# Patient Record
Sex: Male | Born: 1970 | Race: White | Hispanic: No | Marital: Single | Smoking: Current every day smoker
Health system: Southern US, Community
[De-identification: ages and names within clinical notes are randomized; demographics above are authoritative.]

---

## 2012-10-15 ENCOUNTER — Other Ambulatory Visit: Payer: Self-pay | Admitting: Family Medicine

## 2012-10-15 ENCOUNTER — Ambulatory Visit
Admission: RE | Admit: 2012-10-15 | Discharge: 2012-10-15 | Disposition: A | Discharge status: Home / Self Care | Payer: BC Managed Care – PPO | Source: Ambulatory Visit | Attending: Family Medicine | Admitting: Family Medicine

## 2012-10-15 DIAGNOSIS — R52 Pain, unspecified: Secondary | ICD-10-CM

## 2012-10-15 DIAGNOSIS — R059 Cough, unspecified: Secondary | ICD-10-CM

## 2012-10-15 DIAGNOSIS — R05 Cough: Secondary | ICD-10-CM

## 2013-11-03 ENCOUNTER — Other Ambulatory Visit: Payer: Self-pay | Admitting: Family Medicine

## 2013-11-03 DIAGNOSIS — R0789 Other chest pain: Secondary | ICD-10-CM

## 2013-11-10 ENCOUNTER — Ambulatory Visit
Admission: RE | Admit: 2013-11-10 | Discharge: 2013-11-10 | Disposition: A | Discharge status: Home / Self Care | Payer: BC Managed Care – PPO | Source: Ambulatory Visit | Attending: Family Medicine | Admitting: Family Medicine

## 2013-11-10 DIAGNOSIS — R0789 Other chest pain: Secondary | ICD-10-CM

## 2013-11-10 MED ORDER — IOHEXOL 300 MG/ML  SOLN
75.0000 mL | Freq: Once | INTRAMUSCULAR | Status: AC | PRN
  Administered 2013-11-10: 75 mL via INTRAVENOUS

## 2013-11-13 ENCOUNTER — Other Ambulatory Visit: Payer: Self-pay | Admitting: Family Medicine

## 2013-11-13 DIAGNOSIS — R1011 Right upper quadrant pain: Secondary | ICD-10-CM

## 2013-12-02 ENCOUNTER — Ambulatory Visit
Admission: RE | Admit: 2013-12-02 | Discharge: 2013-12-02 | Disposition: A | Discharge status: Home / Self Care | Payer: BC Managed Care – PPO | Source: Ambulatory Visit | Attending: Family Medicine | Admitting: Family Medicine

## 2013-12-02 DIAGNOSIS — R1011 Right upper quadrant pain: Secondary | ICD-10-CM

## 2014-01-12 ENCOUNTER — Ambulatory Visit: Payer: BC Managed Care – PPO | Admitting: Dietician

## 2014-05-04 ENCOUNTER — Other Ambulatory Visit: Payer: Self-pay | Admitting: Geriatric Medicine

## 2014-05-04 DIAGNOSIS — R911 Solitary pulmonary nodule: Secondary | ICD-10-CM

## 2014-05-07 ENCOUNTER — Ambulatory Visit
Admission: RE | Admit: 2014-05-07 | Discharge: 2014-05-07 | Disposition: A | Discharge status: Home / Self Care | Payer: BC Managed Care – PPO | Source: Ambulatory Visit | Attending: Geriatric Medicine | Admitting: Geriatric Medicine

## 2014-05-07 DIAGNOSIS — R911 Solitary pulmonary nodule: Secondary | ICD-10-CM

## 2015-05-06 ENCOUNTER — Other Ambulatory Visit: Payer: Self-pay | Admitting: Geriatric Medicine

## 2015-05-06 DIAGNOSIS — R911 Solitary pulmonary nodule: Secondary | ICD-10-CM

## 2015-05-07 ENCOUNTER — Ambulatory Visit
Admission: RE | Admit: 2015-05-07 | Discharge: 2015-05-07 | Disposition: A | Discharge status: Home / Self Care | Payer: BC Managed Care – PPO | Source: Ambulatory Visit | Attending: Geriatric Medicine | Admitting: Geriatric Medicine

## 2015-05-07 DIAGNOSIS — R911 Solitary pulmonary nodule: Secondary | ICD-10-CM

## 2015-06-16 IMAGING — US US ABDOMEN COMPLETE
1 series · 14 of 25 positions shown · non-contrast
Comparison: None.

CLINICAL DATA: Right upper quadrant abdominal pain.

EXAM:
ULTRASOUND ABDOMEN COMPLETE

[Series 1: us abdomen complete · 0.41mm/px · 14 of 99 slices shown]
[im 1/99]
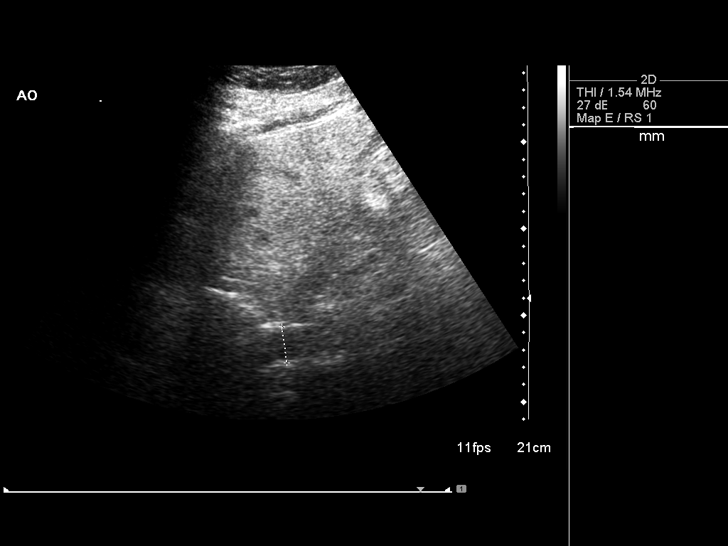
[im 9/99]
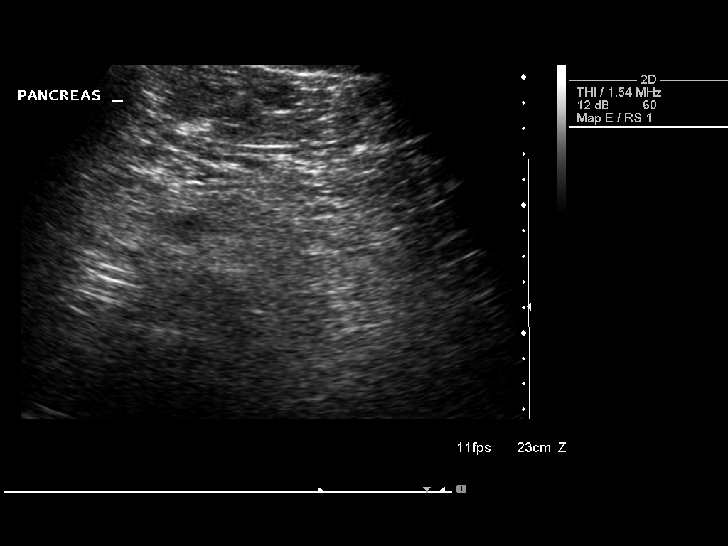
[im 17/99]
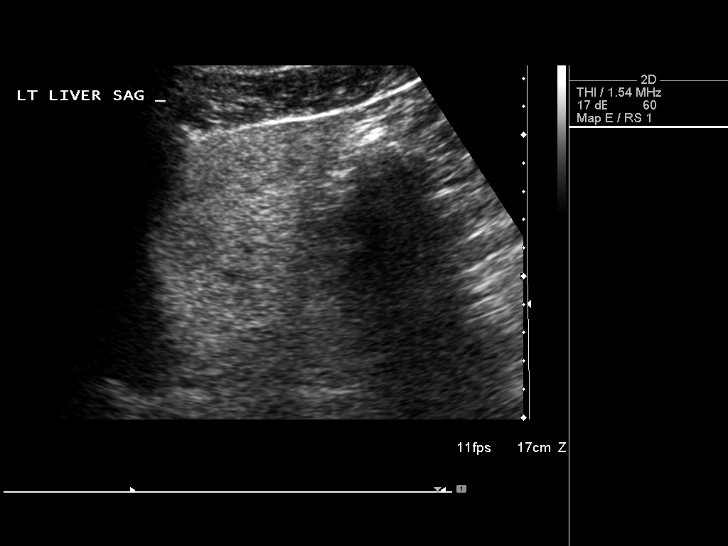
[im 25/99]
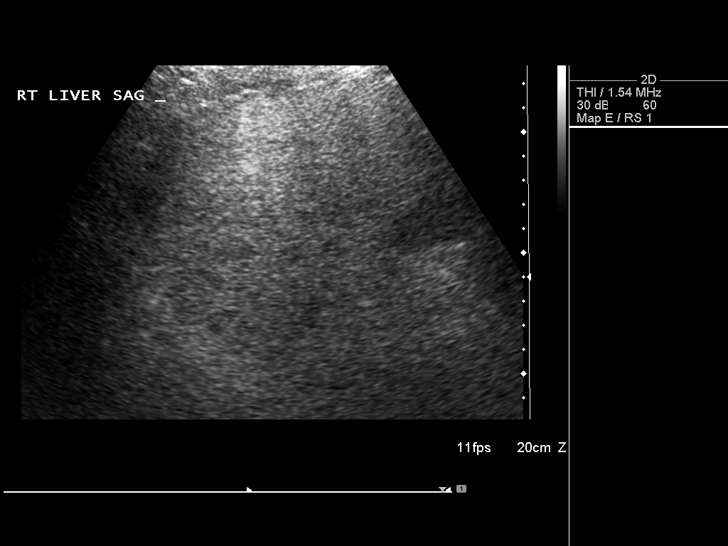
[im 33/99]
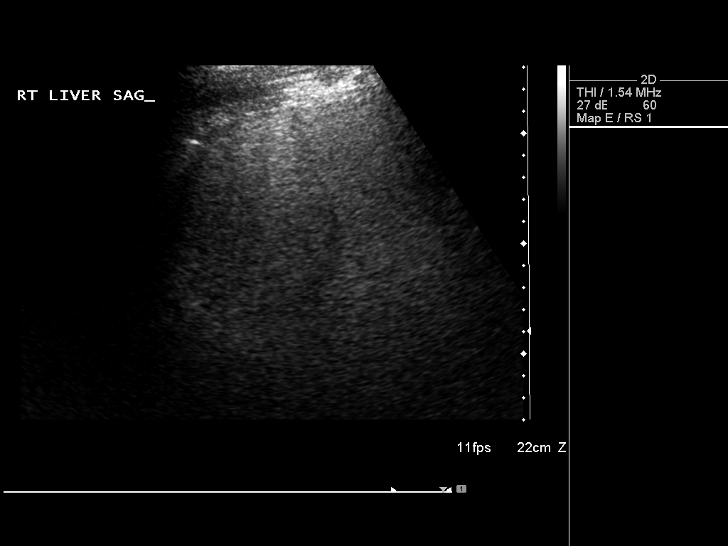
[im 37/99]
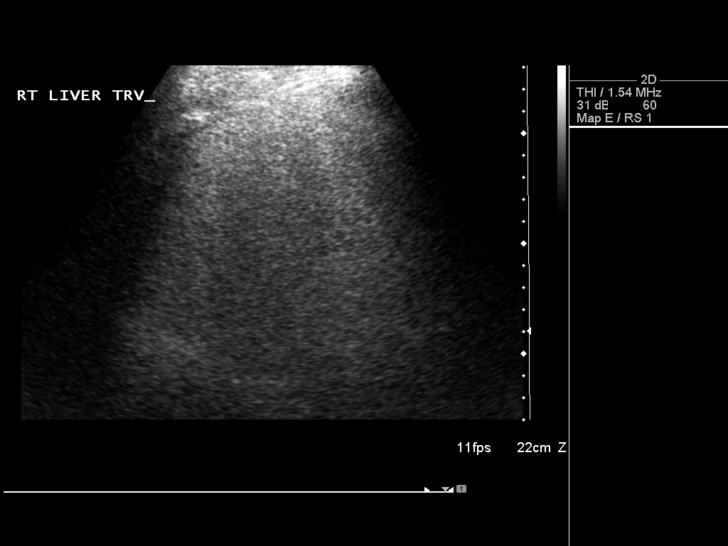
[im 45/99]
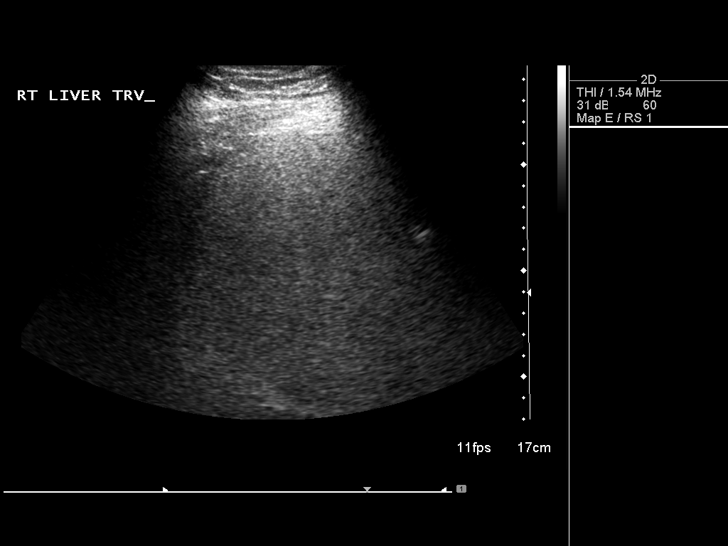
[im 54/99]
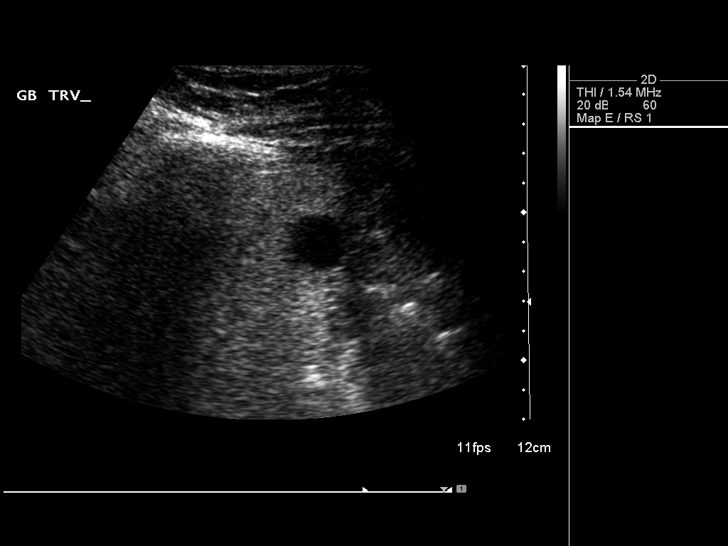
[im 62/99]
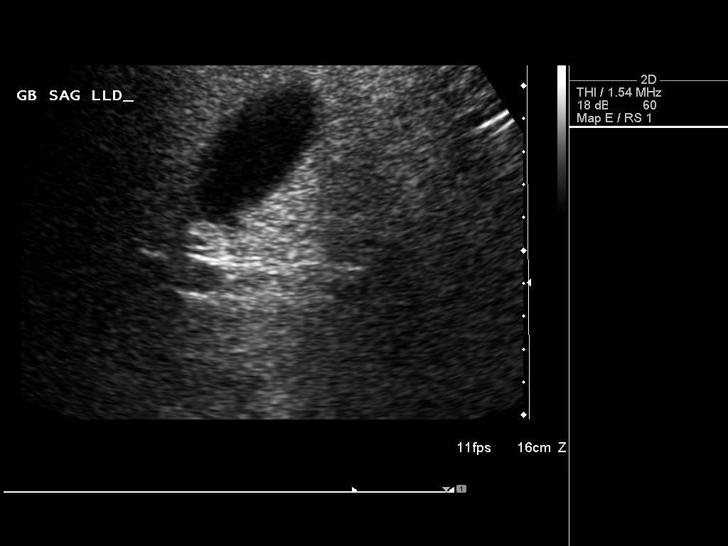
[im 66/99]
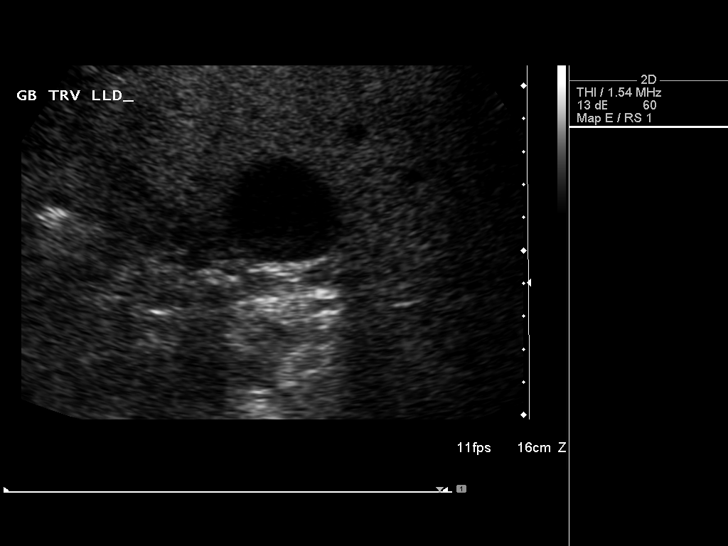
[im 74/99]
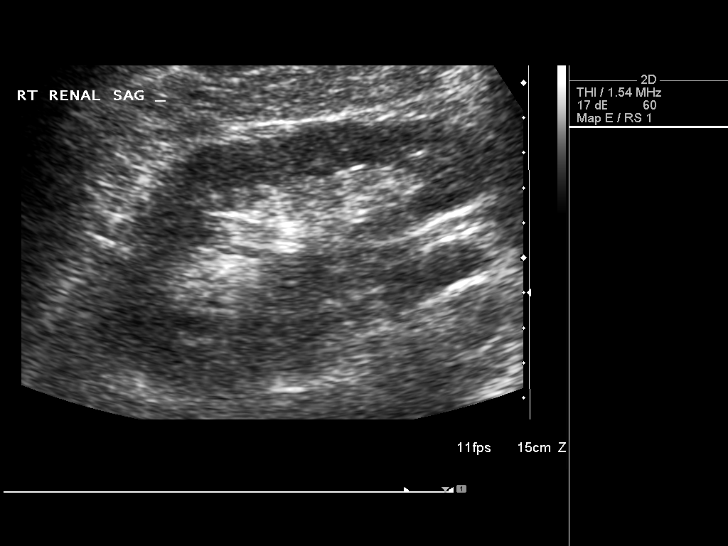
[im 82/99]
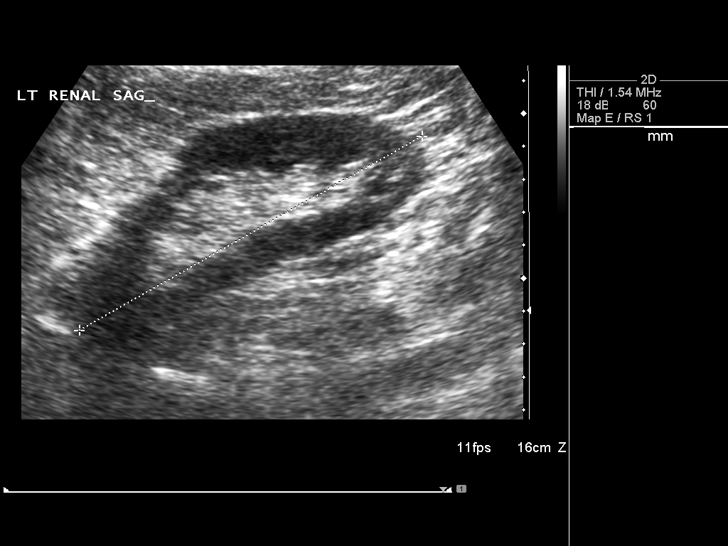
[im 90/99]
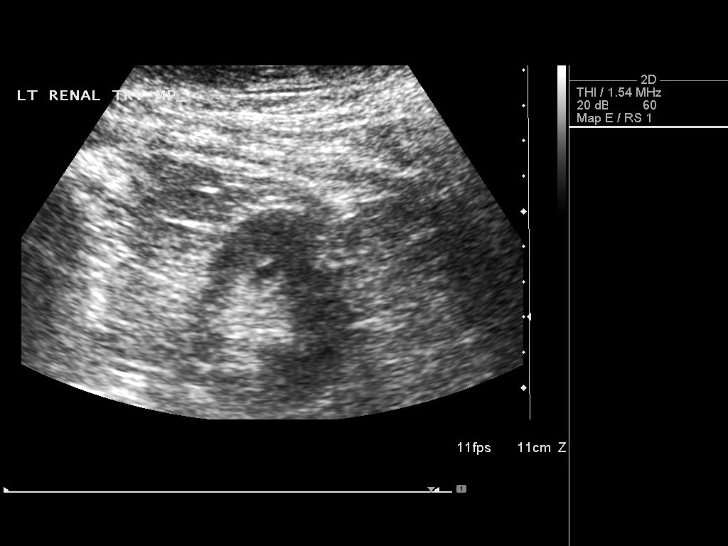
[im 99/99]
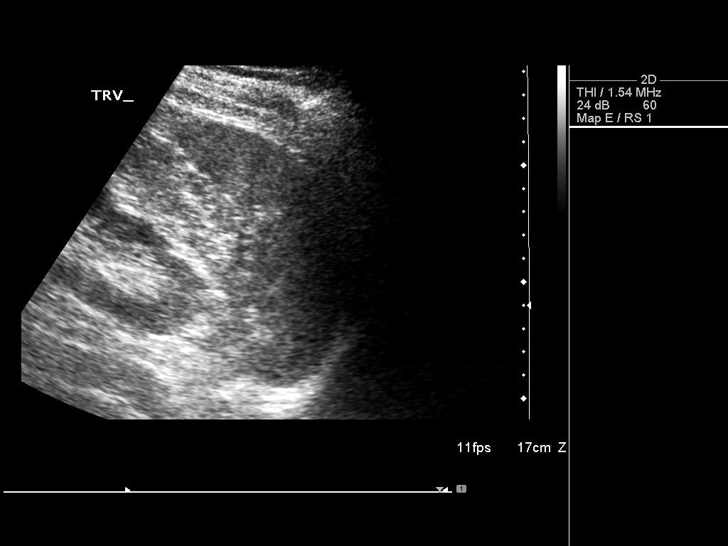

[14 of 25 positions shown; findings below may reference images not displayed]

FINDINGS: Gallbladder: No gallstones or wall thickening visualized. No
sonographic Murphy sign noted.

Common bile duct: Diameter: 3.5 mm

Liver: Liver is echogenic with decreased through transmission of the
sound beam. No liver mass or focal lesion. Hepatopetal flow
documented in the portal vein. Liver normal in overall size.

IVC: No abnormality visualized.

Pancreas: Visualized portion unremarkable.

Spleen: Size and appearance within normal limits.

Right Kidney: Length: 13.0 cm. Echogenicity within normal limits. No
mass or hydronephrosis visualized.

Left Kidney: Length: 12.7 cm. Echogenicity within normal limits. No
mass or hydronephrosis visualized.

Abdominal aorta: No aneurysm visualized.

Other findings: None.
IMPRESSION: 1. No acute findings.  Normal gallbladder.  No bile duct dilation.
2. Hepatic steatosis.  No other abnormalities.

## 2016-06-09 ENCOUNTER — Other Ambulatory Visit: Payer: Self-pay | Admitting: Geriatric Medicine

## 2016-06-09 DIAGNOSIS — R911 Solitary pulmonary nodule: Secondary | ICD-10-CM

## 2016-06-15 ENCOUNTER — Other Ambulatory Visit: Payer: BC Managed Care – PPO

## 2018-08-16 ENCOUNTER — Other Ambulatory Visit: Payer: Self-pay | Admitting: Geriatric Medicine

## 2018-08-16 DIAGNOSIS — R911 Solitary pulmonary nodule: Secondary | ICD-10-CM

## 2018-11-21 ENCOUNTER — Ambulatory Visit
Admission: RE | Admit: 2018-11-21 | Discharge: 2018-11-21 | Disposition: A | Discharge status: Home / Self Care | Payer: BC Managed Care – PPO | Source: Ambulatory Visit | Attending: Geriatric Medicine | Admitting: Geriatric Medicine

## 2018-11-21 DIAGNOSIS — R911 Solitary pulmonary nodule: Secondary | ICD-10-CM

## 2019-09-10 ENCOUNTER — Other Ambulatory Visit: Payer: Self-pay | Admitting: Geriatric Medicine

## 2019-09-10 ENCOUNTER — Other Ambulatory Visit (HOSPITAL_COMMUNITY): Payer: Self-pay | Admitting: Geriatric Medicine

## 2019-09-10 DIAGNOSIS — K760 Fatty (change of) liver, not elsewhere classified: Secondary | ICD-10-CM

## 2019-09-16 ENCOUNTER — Other Ambulatory Visit: Payer: Self-pay

## 2019-09-16 ENCOUNTER — Ambulatory Visit (HOSPITAL_COMMUNITY)
Admission: RE | Admit: 2019-09-16 | Discharge: 2019-09-16 | Disposition: A | Discharge status: Home / Self Care | Payer: BC Managed Care – PPO | Source: Ambulatory Visit | Attending: Geriatric Medicine | Admitting: Geriatric Medicine

## 2019-09-16 DIAGNOSIS — K760 Fatty (change of) liver, not elsewhere classified: Secondary | ICD-10-CM | POA: Insufficient documentation

## 2020-10-05 ENCOUNTER — Other Ambulatory Visit: Payer: Self-pay

## 2020-10-05 ENCOUNTER — Encounter (HOSPITAL_COMMUNITY): Payer: Self-pay | Admitting: Emergency Medicine

## 2020-10-05 ENCOUNTER — Emergency Department (HOSPITAL_COMMUNITY): Payer: BC Managed Care – PPO

## 2020-10-05 ENCOUNTER — Observation Stay (HOSPITAL_COMMUNITY)
Admission: EM | Admit: 2020-10-05 | Discharge: 2020-10-07 | Disposition: A | Discharge status: Home / Self Care | Payer: BC Managed Care – PPO | Attending: Internal Medicine | Admitting: Internal Medicine

## 2020-10-05 DIAGNOSIS — I214 Non-ST elevation (NSTEMI) myocardial infarction: Secondary | ICD-10-CM | POA: Diagnosis not present

## 2020-10-05 DIAGNOSIS — Z23 Encounter for immunization: Secondary | ICD-10-CM | POA: Insufficient documentation

## 2020-10-05 DIAGNOSIS — E785 Hyperlipidemia, unspecified: Secondary | ICD-10-CM

## 2020-10-05 DIAGNOSIS — Z20822 Contact with and (suspected) exposure to covid-19: Secondary | ICD-10-CM | POA: Diagnosis not present

## 2020-10-05 DIAGNOSIS — I10 Essential (primary) hypertension: Secondary | ICD-10-CM

## 2020-10-05 DIAGNOSIS — Z720 Tobacco use: Secondary | ICD-10-CM

## 2020-10-05 DIAGNOSIS — R079 Chest pain, unspecified: Secondary | ICD-10-CM | POA: Diagnosis present

## 2020-10-05 DIAGNOSIS — Z87891 Personal history of nicotine dependence: Secondary | ICD-10-CM

## 2020-10-05 LAB — CBC
HCT: 49.1 % (ref 39.0–52.0)
Hemoglobin: 17.5 g/dL — ABNORMAL HIGH (ref 13.0–17.0)
MCH: 33.2 pg (ref 26.0–34.0)
MCHC: 35.6 g/dL (ref 30.0–36.0)
MCV: 93.2 fL (ref 80.0–100.0)
Platelets: 202 10*3/uL (ref 150–400)
RBC: 5.27 MIL/uL (ref 4.22–5.81)
RDW: 11.3 % — ABNORMAL LOW (ref 11.5–15.5)
WBC: 10.8 10*3/uL — ABNORMAL HIGH (ref 4.0–10.5)
nRBC: 0 % (ref 0.0–0.2)

## 2020-10-05 LAB — BASIC METABOLIC PANEL
Anion gap: 11 (ref 5–15)
BUN: 11 mg/dL (ref 6–20)
CO2: 24 mmol/L (ref 22–32)
Calcium: 9.1 mg/dL (ref 8.9–10.3)
Chloride: 102 mmol/L (ref 98–111)
Creatinine, Ser: 0.92 mg/dL (ref 0.61–1.24)
GFR, Estimated: >60 mL/min (ref >60)
Glucose, Bld: 110 mg/dL — ABNORMAL HIGH (ref 70–99)
Potassium: 3.7 mmol/L (ref 3.5–5.1)
Sodium: 137 mmol/L (ref 135–145)

## 2020-10-05 LAB — TROPONIN I (HIGH SENSITIVITY)
Troponin I (High Sensitivity): 39 ng/L — ABNORMAL HIGH (ref <18)
Troponin I (High Sensitivity): 195 ng/L (ref <18)

## 2020-10-05 LAB — PROTIME-INR
INR: 1.1 (ref 0.8–1.2)
Prothrombin Time: 14.4 seconds (ref 11.4–15.2)

## 2020-10-05 MED ORDER — ATORVASTATIN CALCIUM 80 MG PO TABS
80.0000 mg | ORAL_TABLET | Freq: Every day | ORAL | Status: DC
  Administered 2020-10-06 – 2020-10-07 (×3): 80 mg via ORAL
  Filled 2020-10-05 (×2): qty 1
  Filled 2020-10-05: qty 2

## 2020-10-05 MED ORDER — HEPARIN (PORCINE) 25000 UT/250ML-% IV SOLN
1100.0000 [IU]/h | INTRAVENOUS | Status: DC
Start: 2020-10-05 — End: 2020-10-06
  Administered 2020-10-05: 1400 [IU]/h via INTRAVENOUS
  Filled 2020-10-05: qty 250

## 2020-10-05 MED ORDER — ASPIRIN 81 MG PO CHEW
324.0000 mg | CHEWABLE_TABLET | Freq: Once | ORAL | Status: AC
  Administered 2020-10-05: 324 mg via ORAL
  Filled 2020-10-05: qty 4

## 2020-10-05 MED ORDER — METOPROLOL TARTRATE 25 MG PO TABS
12.5000 mg | ORAL_TABLET | Freq: Two times a day (BID) | ORAL | Status: DC
  Administered 2020-10-06: 12.5 mg via ORAL
  Filled 2020-10-05: qty 1

## 2020-10-05 MED ORDER — HEPARIN BOLUS VIA INFUSION
4000.0000 [IU] | Freq: Once | INTRAVENOUS | Status: AC
  Administered 2020-10-05: 4000 [IU] via INTRAVENOUS
  Filled 2020-10-05: qty 4000

## 2020-10-05 MED ORDER — ASPIRIN 81 MG PO CHEW
81.0000 mg | CHEWABLE_TABLET | Freq: Every day | ORAL | Status: DC
Start: 2020-10-06 — End: 2020-10-07
  Administered 2020-10-06 – 2020-10-07 (×2): 81 mg via ORAL
  Filled 2020-10-05 (×2): qty 1

## 2020-10-05 MED ORDER — NITROGLYCERIN IN D5W 200-5 MCG/ML-% IV SOLN
0.0000 ug/min | INTRAVENOUS | Status: DC
  Administered 2020-10-05: 5 ug/min via INTRAVENOUS
  Filled 2020-10-05: qty 250

## 2020-10-05 NOTE — ED Provider Notes (Signed)
[facility] EMERGENCY DEPARTMENT Provider Note   CSN: [id number] Arrival date & time: 10/05/20  1725     History Chief Complaint  Patient presents with   Chest Pain    Sean Kirk is a 50 y.o. male.  Patient presents to the emergency department with a chief complaint of chest tightness.  Onset was about 3 PM this afternoon.  He states that his symptoms have been waxing and waning since then.  He describes the pain as a tightness or "stretching" sensation.  States that he initially had some pain that radiated up into his jaw, but this is ceased.  He has not taken anything for his symptoms prior to arrival.  Cardiac risk factors include smoking.  States that he does not have diabetes, hypertension, and his cholesterol has been fairly well controlled.  Denies any family history of early heart disease.  The history is provided by the patient. No language interpreter was used.      History reviewed. No pertinent past medical history.  There are no problems to display for this patient.   History reviewed. No pertinent surgical history.     No family history on file.     Home Medications Prior to Admission medications   Not on File    Allergies    Patient has no allergy information on record.  Review of Systems   Review of Systems  All other systems reviewed and are negative.  Physical Exam Updated Vital Signs BP (!) 160/102 (BP Location: Left Arm)   Pulse 63   Temp 98.5 F (36.9 C) (Oral)   Resp 18   Ht 5\' 11"  (1.803 m)   Wt 108.9 kg   SpO2 100%   BMI 33.47 kg/m   Physical Exam Vitals and nursing note reviewed.  Constitutional:      Appearance: He is well-developed.  HENT:     Head: Normocephalic and atraumatic.  Eyes:     Conjunctiva/sclera: Conjunctivae normal.  Cardiovascular:     Rate and Rhythm: Normal rate and regular rhythm.     Heart sounds: No murmur heard. Pulmonary:     Effort: Pulmonary effort is normal. No respiratory  distress.     Breath sounds: Normal breath sounds.  Abdominal:     Palpations: Abdomen is soft.     Tenderness: There is no abdominal tenderness.  Musculoskeletal:        General: Normal range of motion.     Cervical back: Neck supple.  Skin:    General: Skin is warm and dry.  Neurological:     Mental Status: He is alert and oriented to person, place, and time.  Psychiatric:        Mood and Affect: Mood normal.        Behavior: Behavior normal.    ED Results / Procedures / Treatments   Labs (all labs ordered are listed, but only abnormal results are displayed) Labs Reviewed  BASIC METABOLIC PANEL - Abnormal; Notable for the following components:      Result Value   Glucose, Bld 110 (*)    All other components within normal limits  CBC - Abnormal; Notable for the following components:   WBC 10.8 (*)    Hemoglobin 17.5 (*)    RDW 11.3 (*)    All other components within normal limits  TROPONIN I (HIGH SENSITIVITY) - Abnormal; Notable for the following components:   Troponin I (High Sensitivity) 39 (*)    All other components within  normal limits  TROPONIN I (HIGH SENSITIVITY) - Abnormal; Notable for the following components:   Troponin I (High Sensitivity) 195 (*)    All other components within normal limits  RESP PANEL BY RT-PCR (FLU A&B, COVID) [id number]  HEPARIN LEVEL (UNFRACTIONATED)  PROTIME-INR  LIPID PANEL  HEMOGLOBIN A1C    EKG EKG Interpretation  Date/Time:  Tuesday October 05 2020 22:57:20 EDT Ventricular Rate:  59 PR Interval:  162 QRS Duration: 86 QT Interval:  400 QTC Calculation: 397 R Axis:   42 Text Interpretation: Sinus rhythm Abnormal R-wave progression, early transition No significant change was found Confirmed by Glynn Octave [phone]) on 10/05/2020 11:00:28 PM  Radiology DG Chest 2 View  Result Date: 10/05/2020 CLINICAL DATA:  Chest pressure. EXAM: CHEST - 2 VIEW COMPARISON:  October 15, 2012 FINDINGS: The heart size and mediastinal  contours are within normal limits. Both lungs are clear. Degenerative changes seen throughout the thoracic spine. IMPRESSION: No active cardiopulmonary disease. Electronically Signed   By: Aram Candela M.D.   On: 10/05/2020 18:19    Procedures .Critical Care Performed by: Roxy Horseman, PA-C Authorized by: Roxy Horseman, PA-C   Critical care provider statement:    Critical care time (minutes):  35   Critical care was necessary to treat or prevent imminent or life-threatening deterioration of the following conditions:  Circulatory failure   Critical care was time spent personally by me on the following activities:  Discussions with consultants, evaluation of patient's response to treatment, examination of patient, ordering and performing treatments and interventions, ordering and review of laboratory studies, ordering and review of radiographic studies, pulse oximetry, re-evaluation of patient's condition, obtaining history from patient or surrogate and review of old charts   Medications Ordered in ED Medications  nitroGLYCERIN 50 mg in dextrose 5 % 250 mL (0.2 mg/mL) infusion (10 mcg/min Intravenous Rate/Dose Change 10/05/20 2314)  heparin ADULT infusion 100 units/mL (25000 units/298mL) (1,400 Units/hr Intravenous New Bag/Given 10/05/20 2322)  atorvastatin (LIPITOR) tablet 80 mg (has no administration in time range)  aspirin chewable tablet 81 mg (has no administration in time range)  metoprolol tartrate (LOPRESSOR) tablet 12.5 mg (has no administration in time range)  aspirin chewable tablet 324 mg (324 mg Oral Given 10/05/20 2303)  heparin bolus via infusion 4,000 Units (4,000 Units Intravenous Bolus from Bag 10/05/20 2322)    ED Course  I have reviewed the triage vital signs and the nursing notes.  Pertinent labs & imaging results that were available during my care of the patient were reviewed by me and considered in my medical decision making (see chart for details).    MDM  Rules/Calculators/A&P                           Patient here with chest tightness.  Onset 3 PM this afternoon.  Symptoms have been waxing and waning in severity.  Initial troponin was slightly elevated, repeat troponin has increased to 195.  Patient still having some chest discomfort.  We will start heparin, nitroglycerin infusion, and give aspirin.  I discussed the case with Dr. Joyce Gross, from cardiology, who agrees to come and admit the patient. Final Clinical Impression(s) / ED Diagnoses Final diagnoses:  NSTEMI (non-ST elevated myocardial infarction) [facility])    Rx / DC Orders ED Discharge Orders     None        Roxy Horseman, PA-C 10/05/20 2345    Wynetta Fines, MD 10/07/20 1318

## 2020-10-05 NOTE — ED Triage Notes (Signed)
Pt reports around 1530 today he noticed chest pressure, head and neck pain. Pt denies cardiac hx.

## 2020-10-05 NOTE — ED Provider Notes (Signed)
Emergency Medicine Provider Triage Evaluation Note  Sean Kirk , a 50 y.o. male  was evaluated in triage.  Pt complains of chest tightness, neck, shoulder, and jaw pain with associated fatigue. Symptoms initially resolved after 30 minutes, then presented again for about 20 minutes. Checked BP at home and it was higher than normal with systolic in the 150s. No symptoms right now.   Review of Systems  Positive: CP, neck pain, jaw pain, nausea Negative: SOB, vomiting  Physical Exam  BP (!) 151/99 (BP Location: Left Arm)   Pulse 71   Temp 98.5 F (36.9 C) (Oral)   Resp 14   SpO2 100%  Gen:   Awake, no distress   Resp:  Normal effort  MSK:   Moves extremities without difficulty  Other:    Medical Decision Making  Medically screening exam initiated at 5:42 PM.  Appropriate orders placed.  Sean Kirk was informed that the remainder of the evaluation will be completed by another provider, this initial triage assessment does not replace that evaluation, and the importance of remaining in the ED until their evaluation is complete.     Jeanella Flattery 10/05/20 1744    Tegeler, Canary Brim, MD 10/05/20 Mikle Bosworth

## 2020-10-05 NOTE — Progress Notes (Signed)
ANTICOAGULATION CONSULT NOTE - Initial Consult  Pharmacy Consult for Heparin Indication: chest pain/ACS  Not on File  Patient Measurements:   Heparin Dosing Weight: 98.5  Vital Signs: Temp: 98.5 F (36.9 C) (09/20 1734) Temp Source: Oral (09/20 1734) BP: 160/102 (09/20 2222) Pulse Rate: 63 (09/20 2222)  Labs: Recent Labs    10/05/20 1746 10/05/20 2115  HGB 17.5*  --   HCT 49.1  --   PLT 202  --   CREATININE 0.92  --   TROPONINIHS 39* 195*    CrCl cannot be calculated (Unknown ideal weight.).   Medical History: History reviewed. No pertinent past medical history.  Medications:  (Not in a hospital admission)  Scheduled:   aspirin  324 mg Oral Once   Infusions:   nitroGLYCERIN     PRN:   Assessment: 49 yom presenting with chest tightness, neck, shoulder, and jaw pain with associated fatigue. Heparin per pharmacy consult placed for chest pain/ACS.  Patient is on not on anticoagulation prior to arrival.  Hgb17.5;plt 202  Goal of Therapy:  Heparin level 0.3-0.7 units/ml Monitor platelets by anticoagulation protocol: Yes   Plan:  Give 4000 units bolus x 1 Start heparin infusion at 1400 units/hr Check anti-Xa level in 6-8 hours and daily while on heparin Continue to monitor H&H and platelets  Delmar Landau, PharmD, BCPS 10/05/2020 10:59 PM ED Clinical Pharmacist -  [phone]

## 2020-10-06 ENCOUNTER — Encounter (HOSPITAL_COMMUNITY): Payer: Self-pay | Admitting: Internal Medicine

## 2020-10-06 ENCOUNTER — Other Ambulatory Visit (HOSPITAL_COMMUNITY): Payer: BC Managed Care – PPO

## 2020-10-06 ENCOUNTER — Encounter (HOSPITAL_COMMUNITY)
Admission: EM | Disposition: A | Discharge status: Home / Self Care | Payer: Self-pay | Source: Home / Self Care | Attending: Emergency Medicine

## 2020-10-06 DIAGNOSIS — I10 Essential (primary) hypertension: Secondary | ICD-10-CM | POA: Diagnosis not present

## 2020-10-06 DIAGNOSIS — Z20822 Contact with and (suspected) exposure to covid-19: Secondary | ICD-10-CM | POA: Diagnosis not present

## 2020-10-06 DIAGNOSIS — I214 Non-ST elevation (NSTEMI) myocardial infarction: Secondary | ICD-10-CM | POA: Diagnosis not present

## 2020-10-06 DIAGNOSIS — E782 Mixed hyperlipidemia: Secondary | ICD-10-CM

## 2020-10-06 DIAGNOSIS — I2510 Atherosclerotic heart disease of native coronary artery without angina pectoris: Secondary | ICD-10-CM

## 2020-10-06 DIAGNOSIS — Z720 Tobacco use: Secondary | ICD-10-CM | POA: Diagnosis not present

## 2020-10-06 DIAGNOSIS — K760 Fatty (change of) liver, not elsewhere classified: Secondary | ICD-10-CM | POA: Diagnosis not present

## 2020-10-06 DIAGNOSIS — E785 Hyperlipidemia, unspecified: Secondary | ICD-10-CM | POA: Diagnosis not present

## 2020-10-06 HISTORY — PX: CORONARY BALLOON ANGIOPLASTY: CATH118233

## 2020-10-06 HISTORY — PX: LEFT HEART CATH AND CORONARY ANGIOGRAPHY: CATH118249

## 2020-10-06 LAB — POCT ACTIVATED CLOTTING TIME: Activated Clotting Time: 387 seconds

## 2020-10-06 LAB — CBC
HCT: 45.4 % (ref 39.0–52.0)
Hemoglobin: 16.3 g/dL (ref 13.0–17.0)
MCH: 33.5 pg (ref 26.0–34.0)
MCHC: 35.9 g/dL (ref 30.0–36.0)
MCV: 93.4 fL (ref 80.0–100.0)
Platelets: 184 10*3/uL (ref 150–400)
RBC: 4.86 MIL/uL (ref 4.22–5.81)
RDW: 11.4 % — ABNORMAL LOW (ref 11.5–15.5)
WBC: 9.2 10*3/uL (ref 4.0–10.5)
nRBC: 0 % (ref 0.0–0.2)

## 2020-10-06 LAB — COMPREHENSIVE METABOLIC PANEL
ALT: 38 U/L (ref 0–44)
AST: 60 U/L — ABNORMAL HIGH (ref 15–41)
Albumin: 3.6 g/dL (ref 3.5–5.0)
Alkaline Phosphatase: 55 U/L (ref 38–126)
Anion gap: 11 (ref 5–15)
BUN: 9 mg/dL (ref 6–20)
CO2: 21 mmol/L — ABNORMAL LOW (ref 22–32)
Calcium: 8.8 mg/dL — ABNORMAL LOW (ref 8.9–10.3)
Chloride: 105 mmol/L (ref 98–111)
Creatinine, Ser: 0.87 mg/dL (ref 0.61–1.24)
GFR, Estimated: >60 mL/min (ref >60)
Glucose, Bld: 108 mg/dL — ABNORMAL HIGH (ref 70–99)
Potassium: 4 mmol/L (ref 3.5–5.1)
Sodium: 137 mmol/L (ref 135–145)
Total Bilirubin: 1.1 mg/dL (ref 0.3–1.2)
Total Protein: 6.4 g/dL — ABNORMAL LOW (ref 6.5–8.1)

## 2020-10-06 LAB — LIPID PANEL
Cholesterol: 178 mg/dL (ref 0–200)
HDL: 34 mg/dL — ABNORMAL LOW (ref >40)
LDL Cholesterol: 120 mg/dL — ABNORMAL HIGH (ref 0–99)
Total CHOL/HDL Ratio: 5.2 RATIO
Triglycerides: 121 mg/dL (ref <150)
VLDL: 24 mg/dL (ref 0–40)

## 2020-10-06 LAB — RESP PANEL BY RT-PCR (FLU A&B, COVID) ARPGX2
Influenza A by PCR: NEGATIVE
Influenza B by PCR: NEGATIVE
SARS Coronavirus 2 by RT PCR: NEGATIVE

## 2020-10-06 LAB — BRAIN NATRIURETIC PEPTIDE: B Natriuretic Peptide: 64.8 pg/mL (ref 0.0–100.0)

## 2020-10-06 LAB — TSH: TSH: 2.504 u[IU]/mL (ref 0.350–4.500)

## 2020-10-06 LAB — HEMOGLOBIN A1C
Hgb A1c MFr Bld: 5.2 % (ref 4.8–5.6)
Mean Plasma Glucose: 102.54 mg/dL

## 2020-10-06 LAB — FERRITIN: Ferritin: 320 ng/mL (ref 24–336)

## 2020-10-06 LAB — HIV ANTIBODY (ROUTINE TESTING W REFLEX): HIV Screen 4th Generation wRfx: NONREACTIVE

## 2020-10-06 LAB — T4, FREE: Free T4: 1.13 ng/dL — ABNORMAL HIGH (ref 0.61–1.12)

## 2020-10-06 LAB — HEPARIN LEVEL (UNFRACTIONATED): Heparin Unfractionated: 0.99 IU/mL — ABNORMAL HIGH (ref 0.30–0.70)

## 2020-10-06 SURGERY — LEFT HEART CATH AND CORONARY ANGIOGRAPHY
Anesthesia: LOCAL

## 2020-10-06 MED ORDER — HEPARIN (PORCINE) IN NACL 1000-0.9 UT/500ML-% IV SOLN
INTRAVENOUS | Status: DC | PRN
  Administered 2020-10-06 (×2): 500 mL

## 2020-10-06 MED ORDER — NITROGLYCERIN 0.4 MG SL SUBL: 0.4000 mg | SUBLINGUAL_TABLET | SUBLINGUAL | Status: DC | PRN

## 2020-10-06 MED ORDER — SODIUM CHLORIDE 0.9 % IV SOLN: 250.0000 mL | INTRAVENOUS | Status: DC | PRN

## 2020-10-06 MED ORDER — MIDAZOLAM HCL 2 MG/2ML IJ SOLN
INTRAMUSCULAR | Status: DC | PRN
  Administered 2020-10-06 (×2): 1 mg via INTRAVENOUS

## 2020-10-06 MED ORDER — SODIUM CHLORIDE 0.9% FLUSH: 3.0000 mL | INTRAVENOUS | Status: DC | PRN

## 2020-10-06 MED ORDER — FENTANYL CITRATE (PF) 100 MCG/2ML IJ SOLN
INTRAMUSCULAR | Status: AC
  Filled 2020-10-06: qty 2

## 2020-10-06 MED ORDER — NICOTINE 14 MG/24HR TD PT24: 14.0000 mg | MEDICATED_PATCH | Freq: Every day | TRANSDERMAL | Status: DC

## 2020-10-06 MED ORDER — INFLUENZA VAC SPLIT QUAD 0.5 ML IM SUSY
0.5000 mL | PREFILLED_SYRINGE | INTRAMUSCULAR | Status: AC
  Administered 2020-10-07: 0.5 mL via INTRAMUSCULAR
  Filled 2020-10-06: qty 0.5

## 2020-10-06 MED ORDER — SODIUM CHLORIDE 0.9 % WEIGHT BASED INFUSION: 1.0000 mL/kg/h | INTRAVENOUS | Status: DC

## 2020-10-06 MED ORDER — ACETAMINOPHEN 325 MG PO TABS
650.0000 mg | ORAL_TABLET | ORAL | Status: DC | PRN
  Administered 2020-10-06: 650 mg via ORAL
  Filled 2020-10-06: qty 2

## 2020-10-06 MED ORDER — VERAPAMIL HCL 2.5 MG/ML IV SOLN
INTRAVENOUS | Status: DC | PRN
  Administered 2020-10-06: 10 mL via INTRA_ARTERIAL

## 2020-10-06 MED ORDER — HEPARIN SODIUM (PORCINE) 1000 UNIT/ML IJ SOLN
INTRAMUSCULAR | Status: AC
  Filled 2020-10-06: qty 1

## 2020-10-06 MED ORDER — CLOPIDOGREL BISULFATE 75 MG PO TABS
75.0000 mg | ORAL_TABLET | Freq: Every day | ORAL | Status: DC
  Administered 2020-10-06 – 2020-10-07 (×2): 75 mg via ORAL
  Filled 2020-10-06 (×2): qty 1

## 2020-10-06 MED ORDER — SODIUM CHLORIDE 0.9% FLUSH: 3.0000 mL | Freq: Two times a day (BID) | INTRAVENOUS | Status: DC

## 2020-10-06 MED ORDER — NICOTINE POLACRILEX 2 MG MT GUM: 2.0000 mg | CHEWING_GUM | OROMUCOSAL | Status: DC | PRN

## 2020-10-06 MED ORDER — LIDOCAINE HCL (PF) 1 % IJ SOLN
INTRAMUSCULAR | Status: AC
  Filled 2020-10-06: qty 30

## 2020-10-06 MED ORDER — SODIUM CHLORIDE 0.9 % WEIGHT BASED INFUSION
3.0000 mL/kg/h | INTRAVENOUS | Status: DC
  Administered 2020-10-06: 3 mL/kg/h via INTRAVENOUS

## 2020-10-06 MED ORDER — MIDAZOLAM HCL 2 MG/2ML IJ SOLN
INTRAMUSCULAR | Status: AC
  Filled 2020-10-06: qty 2

## 2020-10-06 MED ORDER — HEPARIN (PORCINE) IN NACL 1000-0.9 UT/500ML-% IV SOLN
INTRAVENOUS | Status: AC
  Filled 2020-10-06: qty 500

## 2020-10-06 MED ORDER — LIDOCAINE HCL (PF) 1 % IJ SOLN
INTRAMUSCULAR | Status: DC | PRN
  Administered 2020-10-06: 3 mL

## 2020-10-06 MED ORDER — SODIUM CHLORIDE 0.9 % IV SOLN: INTRAVENOUS | Status: AC

## 2020-10-06 MED ORDER — ASPIRIN 81 MG PO CHEW: 81.0000 mg | CHEWABLE_TABLET | ORAL | Status: DC

## 2020-10-06 MED ORDER — VERAPAMIL HCL 2.5 MG/ML IV SOLN
INTRAVENOUS | Status: AC
  Filled 2020-10-06: qty 2

## 2020-10-06 MED ORDER — SODIUM CHLORIDE 0.9% FLUSH
3.0000 mL | Freq: Two times a day (BID) | INTRAVENOUS | Status: DC
  Administered 2020-10-06 – 2020-10-07 (×2): 3 mL via INTRAVENOUS

## 2020-10-06 MED ORDER — ONDANSETRON HCL 4 MG/2ML IJ SOLN: 4.0000 mg | Freq: Four times a day (QID) | INTRAMUSCULAR | Status: DC | PRN

## 2020-10-06 MED ORDER — HEPARIN SODIUM (PORCINE) 1000 UNIT/ML IJ SOLN
INTRAMUSCULAR | Status: DC | PRN
  Administered 2020-10-06: 5000 [IU] via INTRAVENOUS
  Administered 2020-10-06: 6000 [IU] via INTRAVENOUS

## 2020-10-06 MED ORDER — NICOTINE 21 MG/24HR TD PT24
21.0000 mg | MEDICATED_PATCH | Freq: Every day | TRANSDERMAL | Status: DC
  Filled 2020-10-06 (×2): qty 1

## 2020-10-06 MED ORDER — FENTANYL CITRATE (PF) 100 MCG/2ML IJ SOLN
INTRAMUSCULAR | Status: DC | PRN
  Administered 2020-10-06: 50 ug via INTRAVENOUS

## 2020-10-06 MED ORDER — IOHEXOL 350 MG/ML SOLN
INTRAVENOUS | Status: DC | PRN
  Administered 2020-10-06: 275 mL

## 2020-10-06 MED ORDER — NITROGLYCERIN 1 MG/10 ML FOR IR/CATH LAB
INTRA_ARTERIAL | Status: DC | PRN
  Administered 2020-10-06: 200 ug via INTRACORONARY
  Administered 2020-10-06: 200 ug via INTRA_ARTERIAL

## 2020-10-06 MED ORDER — CLOPIDOGREL BISULFATE 75 MG PO TABS
ORAL_TABLET | ORAL | Status: AC
  Filled 2020-10-06: qty 1

## 2020-10-06 MED ORDER — NITROGLYCERIN 1 MG/10 ML FOR IR/CATH LAB
INTRA_ARTERIAL | Status: AC
  Filled 2020-10-06: qty 10

## 2020-10-06 SURGICAL SUPPLY — 16 items
BALLN SAPPHIRE 2.0X12 (BALLOONS)
BALLOON SAPPHIRE 2.0X12 (BALLOONS) IMPLANT
CATH INFINITI 5FR JK (CATHETERS) ×2 IMPLANT
CATH LAUNCHER 5F EBU3.5 (CATHETERS) ×2 IMPLANT
CATH LAUNCHER 6FR EBU3.5 (CATHETERS) ×2 IMPLANT
DEVICE RAD COMP TR BAND LRG (VASCULAR PRODUCTS) ×2 IMPLANT
GLIDESHEATH SLEND SS 6F .021 (SHEATH) ×2 IMPLANT
GUIDEWIRE INQWIRE 1.5J.035X260 (WIRE) ×1 IMPLANT
INQWIRE 1.5J .035X260CM (WIRE) ×2
KIT ENCORE 26 ADVANTAGE (KITS) ×2 IMPLANT
KIT HEART LEFT (KITS) ×2 IMPLANT
PACK CARDIAC CATHETERIZATION (CUSTOM PROCEDURE TRAY) ×2 IMPLANT
TRANSDUCER W/STOPCOCK (MISCELLANEOUS) ×2 IMPLANT
TUBING CIL FLEX 10 FLL-RA (TUBING) ×2 IMPLANT
WIRE ASAHI FIELDER XT 190CM (WIRE) ×2 IMPLANT
WIRE RUNTHROUGH .014X180CM (WIRE) ×2 IMPLANT

## 2020-10-06 NOTE — Progress Notes (Addendum)
Progress Note  Patient Name: Sean Kirk  Date of Encounter: 10/06/2020  [facility] Cardiologist: None   Subjective   No chest pain this morning since 4am. Nitro was weaned and stopped.   Inpatient Medications    Scheduled Meds:  aspirin  81 mg Oral Daily   atorvastatin  80 mg Oral Daily   nicotine  14 mg Transdermal Daily   Continuous Infusions:  heparin 1,400 Units/hr (10/05/20 2322)   nitroGLYCERIN Stopped (10/06/20 0303)   PRN Meds: acetaminophen, nicotine polacrilex, nitroGLYCERIN, ondansetron (ZOFRAN) IV   Vital Signs    Vitals:   10/06/20 0515 10/06/20 0635 10/06/20 0639 10/06/20 0640  BP: 109/82 111/82 111/82 107/82  Pulse: (!) 58     Resp: 13     Temp:      TempSrc:      SpO2: 97%     Weight:      Height:        Intake/Output Summary (Last 24 hours) at 10/06/2020 0749 Last data filed at 10/06/2020 0308 Gross per 24 hour  Intake 22.7 ml  Output --  Net 22.7 ml   Last 3 Weights 10/05/2020  Weight (lbs) 240 lb  Weight (kg) 108.863 kg      Telemetry    SB 50-60s - Personally Reviewed  ECG    SB 54 bpm - Personally Reviewed  Physical Exam   GEN: No acute distress.   Neck: No JVD Cardiac: RRR, no murmurs, rubs, or gallops.  Respiratory: Clear to auscultation bilaterally. GI: Soft, nontender, non-distended  MS: No edema; No deformity. Neuro:  Nonfocal  Psych: Normal affect   Labs    High Sensitivity Troponin:   Recent Labs  Lab 10/05/20 1746 10/05/20 2115  TROPONINIHS 39* 195*     Chemistry Recent Labs  Lab 10/05/20 1746  NA 137  K 3.7  CL 102  CO2 24  GLUCOSE 110*  BUN 11  CREATININE 0.92  CALCIUM 9.1  GFRNONAA >60  ANIONGAP 11    Lipids No results for input(s): CHOL, TRIG, HDL, LABVLDL, LDLCALC, CHOLHDL in the last 168 hours.  Hematology Recent Labs  Lab 10/05/20 1746 10/06/20 0653  WBC 10.8* 9.2  RBC 5.27 4.86  HGB 17.5* 16.3  HCT 49.1 45.4  MCV 93.2 93.4  MCH 33.2 33.5  MCHC 35.6 35.9  RDW 11.3*  11.4*  PLT 202 184   Thyroid No results for input(s): TSH, FREET4 in the last 168 hours.  BNPNo results for input(s): BNP, PROBNP in the last 168 hours.  DDimer No results for input(s): DDIMER in the last 168 hours.   Radiology    DG Chest 2 View  Result Date: 10/05/2020 CLINICAL DATA:  Chest pressure. EXAM: CHEST - 2 VIEW COMPARISON:  October 15, 2012 FINDINGS: The heart size and mediastinal contours are within normal limits. Both lungs are clear. Degenerative changes seen throughout the thoracic spine. IMPRESSION: No active cardiopulmonary disease. Electronically Signed   By: Aram Candela M.D.   On: 10/05/2020 18:19    Cardiac Studies   N/a   Patient Profile     50 y.o. male with HLD and Non alcoholic fatty liver disease who presented with chest pain and found to have NSTEMI.   Assessment & Plan    NSTEMI: hsTn up to 195. RFs for CAD. Placed on IV heparin and nitro on admission. Nitro gtt weaned, no further episodes of chest pain. Planned for cardiac cath today.  -- The patient understands that risks included but  are not limited to stroke (1 in 1000), death (1 in 1000), kidney failure [usually temporary] (1 in 500), bleeding (1 in 200), allergic reaction [possibly serious] (1 in 200).   -- on ASA, statin, BB   HLD: does not appear he has been on statin PTA -- Lipid pending -- started on atorvastatin 80mg  daily on admission  Tobacco use: cessation advised   For questions or updates, please contact [facility] Please consult [email or web address] for contact info under        Signed, , NP  10/06/2020, 7:49 AM    I have examined the patient and reviewed assessment and plan and discussed with patient.  Agree with above as stated.    Patient presented with chest discomfort.  Troponin elevated consistent with NSTEMI.  Currently no chest discomfort.  He is willing to proceed with cardiac catheterization.  He has been a smoker since he was 20.  He admits to  poor self-care over the past few years.  Long-term, he will need risk factor modification.  LDL 120.  This will now require treatment with high-dose statin.  In terms of his tobacco abuse, I have ordered a 21 mg NicoDerm patch.  He is willing to try to quit smoking at this point.  The patient understands that risks include but are not limited to stroke (1 in 1000), death (1 in 1000), kidney failure [usually temporary] (1 in 500), bleeding (1 in 200), allergic reaction [possibly serious] (1 in 200), and agrees to proceed.  All questions about the cath were answered.  Further plans based on cath results.  10/08/2020

## 2020-10-06 NOTE — Interval H&P Note (Signed)
Cath Lab Visit (complete for each Cath Lab visit)  Clinical Evaluation Leading to the Procedure:   ACS: Yes.    Non-ACS:  n/a    History and Physical Interval Note:  10/06/2020 12:03 PM  Sean Kirk  has presented today for surgery, with the diagnosis of nstemi.  The various methods of treatment have been discussed with the patient and family. After consideration of risks, benefits and other options for treatment, the patient has consented to  Procedure(s): LEFT HEART CATH AND CORONARY ANGIOGRAPHY (N/A) as a surgical intervention.  The patient's history has been reviewed, patient examined, no change in status, stable for surgery.  I have reviewed the patient's chart and labs.  Questions were answered to the patient's satisfaction.     [name]

## 2020-10-06 NOTE — Progress Notes (Signed)
ANTICOAGULATION CONSULT NOTE  Pharmacy Consult for Heparin Indication: chest pain/ACS  Not on File  Patient Measurements: Height: 5\' 11"  (180.3 cm) Weight: 108.9 kg (240 lb) IBW/kg (Calculated) : 75.3 Heparin Dosing Weight: 98.5  Vital Signs: Temp: 98.5 F (36.9 C) (09/21 0812) Temp Source: Oral (09/21 0812) BP: 109/78 (09/21 0800) Pulse Rate: 56 (09/21 0800)  Labs: Recent Labs    10/05/20 1746 10/05/20 2115 10/05/20 2315 10/06/20 0653  HGB 17.5*  --   --  16.3  HCT 49.1  --   --  45.4  PLT 202  --   --  184  LABPROT  --   --  14.4  --   INR  --   --  1.1  --   HEPARINUNFRC  --   --   --  0.99*  CREATININE 0.92  --   --  0.87  TROPONINIHS 39* 195*  --   --      Estimated Creatinine Clearance: 131.7 mL/min (by C-G formula based on SCr of 0.87 mg/dL).   Medical History: History reviewed. No pertinent past medical history.  Medications:  (Not in a hospital admission)  Scheduled:   aspirin  81 mg Oral Daily   atorvastatin  80 mg Oral Daily   nicotine  14 mg Transdermal Daily   Infusions:   heparin 1,400 Units/hr (10/05/20 2322)   nitroGLYCERIN Stopped (10/06/20 0303)   PRN:   Assessment: 49 yom presenting with chest tightness, neck, shoulder, and jaw pain with associated fatigue. Heparin per pharmacy consult placed for chest pain/ACS.  Patient is on not on anticoagulation prior to arrival.  Heparin level above goal this morning at 0.99. CBC within normal limits, no bleeding issues noted. Will adjust rate.   Goal of Therapy:  Heparin level 0.3-0.7 units/ml Monitor platelets by anticoagulation protocol: Yes   Plan:  Decrease heparin infusion to 1100 units/hr Check anti-Xa level in 6-8 hours and daily while on heparin Continue to monitor H&H and platelets  10/08/20 PharmD., BCPS Clinical Pharmacist 10/06/2020 8:36 AM

## 2020-10-06 NOTE — H&P (Signed)
Cardiology Admission History and Physical:   Patient ID: Sean Kirk MRN: [id number]; DOB: 1970-11-05   Admission date: 10/05/2020  PCP:  Merlene Laughter, MD   [facility] Providers Cardiologist:  None       Chief Complaint:  "I felt off"  Patient Profile:   Sean Kirk is a 50 y.o. male with hx of HLD and NAFLD who is being seen 10/06/2020 for the evaluation of NSTEMI.  History of Present Illness:   Sean Kirk has no known history of cardiovascular disease.  He does have a history of hyperlipidemia and nonalcoholic fatty liver disease.  During COVID, Sean Kirk tells me that he did gain a fair amount of weight and was relatively sedentary.  He also drank heavily for extended periods of time over the last few years, sometimes having 30 drinks per week.  He has also smoked at least 1 pack/day since the age of 54.  Over the last couple months, however, he has tried to get back into the gym and is attempting to be more active.  Sean Kirk was in his normal state of health on a Zoom meeting this afternoon when he suddenly felt like something was very off.  He first noticed a right sided neck pain that radiated up into his jaw.  He then felt a stretching discomfort in the center of his chest.  He attempted to finish his meeting and had one of his colleagues, who is a former IT sales professional, help evaluate his symptoms.  During this period, his symptoms briefly improved, although never went away.  He drove himself home to check his blood pressure, but continued to have the stretching feeling in the center of his chest and decided to come to the emergency department for evaluation.  Since arriving to the emergency department, this stretching sensation has improved although is still present at the time of our interview, while on 10 mcgs of nitroglycerin.  Sean Kirk denies ever having an episode like this before.  He has had a few episodes of dizziness while playing golf, although it was quite hot and he  attributed much of this to being out of shape.  On presentation to the emergency department, he was hemodynamically stable.  His initial EKG had some very subtle upsloping ST elevations in the inferolateral leads, although this certainly did not meet STEMI criteria.  His initial troponin was elevated at 35 and on recheck was 195.  He was then started on a heparin and nitroglycerin drip.   History reviewed. No pertinent past medical history.  History reviewed. No pertinent surgical history.   Medications Prior to Admission: Prior to Admission medications   Not on File     Allergies:   Not on File  Social History:   Social History   Socioeconomic History   Marital status: Single    Spouse name: Not on file   Number of children: Not on file   Years of education: Not on file   Highest education level: Not on file  Occupational History   Not on file  Tobacco Use   Smoking status: Every Day    Packs/day: 1.50    Types: Cigarettes   Smokeless tobacco: Not on file  Substance and Sexual Activity   Alcohol use: Yes   Drug use: Yes    Types: Marijuana   Sexual activity: Not on file  Other Topics Concern   Not on file  Social History Narrative   Not on file   Social Determinants of  Health   Financial Resource Strain: Not on file  Food Insecurity: Not on file  Transportation Needs: Not on file  Physical Activity: Not on file  Stress: Not on file  Social Connections: Not on file  Intimate Partner Violence: Not on file    Family History:    The patient's family history includes CAD in his maternal grandmother and maternal uncle.    ROS:  Please see the history of present illness.  All other ROS reviewed and negative.     Physical Exam/Data:   Vitals:   10/05/20 2304 10/05/20 2315 10/05/20 2330 10/06/20 0001  BP:  140/89 (!) 133/94   Pulse:  (!) 57 (!) 55 65  Resp:  (!) 9 (!) 9   Temp:      TempSrc:      SpO2:  99% 99%   Weight: 108.9 kg     Height: 5\' 11"   (1.803 m)      No intake or output data in the 24 hours ending 10/06/20 0033 Last 3 Weights 10/05/2020  Weight (lbs) 240 lb  Weight (kg) 108.863 kg     Body mass index is 33.47 kg/m.  General:  Well nourished, well developed, in no acute distress HEENT: normal Neck: no JVD Vascular: No carotid bruits; Distal pulses 2+ bilaterally   Cardiac:  normal S1, S2; RRR; no murmur  Lungs:  clear to auscultation bilaterally, no wheezing, rhonchi or rales  Abd: soft, nontender, no hepatomegaly  Ext: no edema Musculoskeletal:  No deformities, BUE and BLE strength normal and equal Skin: warm and dry  Neuro:  CNs 2-12 intact, no focal abnormalities noted Psych:  Normal affect   EKG:  The ECG that was done 10/05/20 was personally reviewed and demonstrates NSR, subtle, slight upsloping ST elevations in the inferolateral leads  Relevant CV Studies: None  Laboratory Data:  High Sensitivity Troponin:   Recent Labs  Lab 10/05/20 1746 10/05/20 2115  TROPONINIHS 39* 195*      Chemistry Recent Labs  Lab 10/05/20 1746  NA 137  K 3.7  CL 102  CO2 24  GLUCOSE 110*  BUN 11  CREATININE 0.92  CALCIUM 9.1  GFRNONAA >60  ANIONGAP 11    No results for input(s): PROT, ALBUMIN, AST, ALT, ALKPHOS, BILITOT in the last 168 hours. Lipids No results for input(s): CHOL, TRIG, HDL, LABVLDL, LDLCALC, CHOLHDL in the last 168 hours. Hematology Recent Labs  Lab 10/05/20 1746  WBC 10.8*  RBC 5.27  HGB 17.5*  HCT 49.1  MCV 93.2  MCH 33.2  MCHC 35.6  RDW 11.3*  PLT 202   Thyroid No results for input(s): TSH, FREET4 in the last 168 hours. BNPNo results for input(s): BNP, PROBNP in the last 168 hours.  DDimer No results for input(s): DDIMER in the last 168 hours.   Radiology/Studies:  DG Chest 2 View  Result Date: 10/05/2020 CLINICAL DATA:  Chest pressure. EXAM: CHEST - 2 VIEW COMPARISON:  October 15, 2012 FINDINGS: The heart size and mediastinal contours are within normal limits. Both lungs  are clear. Degenerative changes seen throughout the thoracic spine. IMPRESSION: No active cardiopulmonary disease. Electronically Signed   By: October 17, 2012 M.D.   On: 10/05/2020 18:19     Assessment and Plan:   #NSTEMI: - Abrupt onset of relatively classic symptoms this afternoon. Sean Kirk has several risk factors for CAD. Initial troponin elevated with a significant delta. Continues to have very mild chest discomfort which is improving with a nitro ggt.  -  Cont nitro ggt. Have asked nursing staff to continue to increase ggt until Sean Kirk is chest pain free - Cont heparin ggt. Already loaded with ASA. Start 81 mg qd in the AM  - Start atorva 80 - Start metoprolol 12.5 mg BID  - Echo in the AM  - NPO for LHC in the AM  - Check lipids, A1c, BNP in the AM   #NAFLD - Reports ferritin was highly elevated in the past - Will repeat - Check LFTs in the AM  #Tobacco Abuse - Still smoking 1.5 ppd although seems motivated to quit - Nicotine patches + gum while in house - Smoking cessation referral at d/c  Risk Assessment/Risk Scores:    TIMI Risk Score for Unstable Angina or Non-ST Elevation MI:   The patient's TIMI risk score is 2, which indicates a 8% risk of all cause mortality, new or recurrent myocardial infarction or need for urgent revascularization in the next 14 days.{  Severity of Illness: The appropriate patient status for this patient is OBSERVATION. Observation status is judged to be reasonable and necessary in order to provide the required intensity of service to ensure the patient's safety. The patient's presenting symptoms, physical exam findings, and initial radiographic and laboratory data in the context of their medical condition is felt to place them at decreased risk for further clinical deterioration. Furthermore, it is anticipated that the patient will be medically stable for discharge from the hospital within 2 midnights of admission. The following factors support  the patient status of observation.   " The patient's presenting symptoms include chest pain. " The physical exam findings include N/A. " The initial radiographic and laboratory data are consistent with an NSTEMI.   For questions or updates, please contact [facility] Please consult [email or web address] for contact info under   Signed, Livingston Diones, MD  10/06/2020 12:33 AM

## 2020-10-06 NOTE — H&P (View-Only) (Signed)
Progress Note  Patient Name: Sean Kirk  Date of Encounter: 10/06/2020  [facility] Cardiologist: None   Subjective   No chest pain this morning since 4am. Nitro was weaned and stopped.   Inpatient Medications    Scheduled Meds:  aspirin  81 mg Oral Daily   atorvastatin  80 mg Oral Daily   nicotine  14 mg Transdermal Daily   Continuous Infusions:  heparin 1,400 Units/hr (10/05/20 2322)   nitroGLYCERIN Stopped (10/06/20 0303)   PRN Meds: acetaminophen, nicotine polacrilex, nitroGLYCERIN, ondansetron (ZOFRAN) IV   Vital Signs    Vitals:   10/06/20 0515 10/06/20 0635 10/06/20 0639 10/06/20 0640  BP: 109/82 111/82 111/82 107/82  Pulse: (!) 58     Resp: 13     Temp:      TempSrc:      SpO2: 97%     Weight:      Height:        Intake/Output Summary (Last 24 hours) at 10/06/2020 0749 Last data filed at 10/06/2020 0308 Gross per 24 hour  Intake 22.7 ml  Output --  Net 22.7 ml   Last 3 Weights 10/05/2020  Weight (lbs) 240 lb  Weight (kg) 108.863 kg      Telemetry    SB 50-60s - Personally Reviewed  ECG    SB 54 bpm - Personally Reviewed  Physical Exam   GEN: No acute distress.   Neck: No JVD Cardiac: RRR, no murmurs, rubs, or gallops.  Respiratory: Clear to auscultation bilaterally. GI: Soft, nontender, non-distended  MS: No edema; No deformity. Neuro:  Nonfocal  Psych: Normal affect   Labs    High Sensitivity Troponin:   Recent Labs  Lab 10/05/20 1746 10/05/20 2115  TROPONINIHS 39* 195*     Chemistry Recent Labs  Lab 10/05/20 1746  NA 137  K 3.7  CL 102  CO2 24  GLUCOSE 110*  BUN 11  CREATININE 0.92  CALCIUM 9.1  GFRNONAA >60  ANIONGAP 11    Lipids No results for input(s): CHOL, TRIG, HDL, LABVLDL, LDLCALC, CHOLHDL in the last 168 hours.  Hematology Recent Labs  Lab 10/05/20 1746 10/06/20 0653  WBC 10.8* 9.2  RBC 5.27 4.86  HGB 17.5* 16.3  HCT 49.1 45.4  MCV 93.2 93.4  MCH 33.2 33.5  MCHC 35.6 35.9  RDW 11.3*  11.4*  PLT 202 184   Thyroid No results for input(s): TSH, FREET4 in the last 168 hours.  BNPNo results for input(s): BNP, PROBNP in the last 168 hours.  DDimer No results for input(s): DDIMER in the last 168 hours.   Radiology    DG Chest 2 View  Result Date: 10/05/2020 CLINICAL DATA:  Chest pressure. EXAM: CHEST - 2 VIEW COMPARISON:  October 15, 2012 FINDINGS: The heart size and mediastinal contours are within normal limits. Both lungs are clear. Degenerative changes seen throughout the thoracic spine. IMPRESSION: No active cardiopulmonary disease. Electronically Signed   By: Aram Candela M.D.   On: 10/05/2020 18:19    Cardiac Studies   N/a   Patient Profile     50 y.o. male with HLD and Non alcoholic fatty liver disease who presented with chest pain and found to have NSTEMI.   Assessment & Plan    NSTEMI: hsTn up to 195. RFs for CAD. Placed on IV heparin and nitro on admission. Nitro gtt weaned, no further episodes of chest pain. Planned for cardiac cath today.  -- The patient understands that risks included but  are not limited to stroke (1 in 1000), death (1 in 1000), kidney failure [usually temporary] (1 in 500), bleeding (1 in 200), allergic reaction [possibly serious] (1 in 200).   -- on ASA, statin, BB   HLD: does not appear he has been on statin PTA -- Lipid pending -- started on atorvastatin 80mg  daily on admission  Tobacco use: cessation advised   For questions or updates, please contact [facility] Please consult [email or web address] for contact info under        Signed, , NP  10/06/2020, 7:49 AM    I have examined the patient and reviewed assessment and plan and discussed with patient.  Agree with above as stated.    Patient presented with chest discomfort.  Troponin elevated consistent with NSTEMI.  Currently no chest discomfort.  He is willing to proceed with cardiac catheterization.  He has been a smoker since he was 20.  He admits to  poor self-care over the past few years.  Long-term, he will need risk factor modification.  LDL 120.  This will now require treatment with high-dose statin.  In terms of his tobacco abuse, I have ordered a 21 mg NicoDerm patch.  He is willing to try to quit smoking at this point.  The patient understands that risks include but are not limited to stroke (1 in 1000), death (1 in 1000), kidney failure [usually temporary] (1 in 500), bleeding (1 in 200), allergic reaction [possibly serious] (1 in 200), and agrees to proceed.  All questions about the cath were answered.  Further plans based on cath results.  10/08/2020

## 2020-10-06 NOTE — ED Notes (Signed)
Spoke with Dr. Joyce Gross. He ok'd stopping Nitro drip as long as pt is pain free in light of his BP. Pt is pain free at this time and nitro is stopped.

## 2020-10-07 ENCOUNTER — Inpatient Hospital Stay (HOSPITAL_COMMUNITY): Payer: BC Managed Care – PPO

## 2020-10-07 ENCOUNTER — Other Ambulatory Visit (HOSPITAL_COMMUNITY): Payer: Self-pay

## 2020-10-07 ENCOUNTER — Encounter (HOSPITAL_COMMUNITY): Payer: Self-pay | Admitting: Cardiovascular Disease

## 2020-10-07 DIAGNOSIS — I214 Non-ST elevation (NSTEMI) myocardial infarction: Secondary | ICD-10-CM | POA: Diagnosis not present

## 2020-10-07 DIAGNOSIS — Z87891 Personal history of nicotine dependence: Secondary | ICD-10-CM

## 2020-10-07 DIAGNOSIS — E785 Hyperlipidemia, unspecified: Secondary | ICD-10-CM | POA: Diagnosis not present

## 2020-10-07 DIAGNOSIS — Z720 Tobacco use: Secondary | ICD-10-CM

## 2020-10-07 DIAGNOSIS — Z20822 Contact with and (suspected) exposure to covid-19: Secondary | ICD-10-CM | POA: Diagnosis not present

## 2020-10-07 DIAGNOSIS — I10 Essential (primary) hypertension: Secondary | ICD-10-CM | POA: Diagnosis not present

## 2020-10-07 DIAGNOSIS — E782 Mixed hyperlipidemia: Secondary | ICD-10-CM | POA: Diagnosis not present

## 2020-10-07 LAB — ECHOCARDIOGRAM COMPLETE
AR max vel: 4.91 cm2
AV Area VTI: 4 cm2
AV Area mean vel: 3.89 cm2
AV Mean grad: 3 mmHg
AV Peak grad: 4.7 mmHg
Ao pk vel: 1.08 m/s
Area-P 1/2: 3.01 cm2
Height: 71 in
S' Lateral: 2.6 cm
Single Plane A4C EF: 57.9 %
Weight: 3840 oz

## 2020-10-07 LAB — BASIC METABOLIC PANEL
Anion gap: 6 (ref 5–15)
BUN: 11 mg/dL (ref 6–20)
CO2: 28 mmol/L (ref 22–32)
Calcium: 9 mg/dL (ref 8.9–10.3)
Chloride: 104 mmol/L (ref 98–111)
Creatinine, Ser: 1.08 mg/dL (ref 0.61–1.24)
GFR, Estimated: >60 mL/min (ref >60)
Glucose, Bld: 108 mg/dL — ABNORMAL HIGH (ref 70–99)
Potassium: 4.3 mmol/L (ref 3.5–5.1)
Sodium: 138 mmol/L (ref 135–145)

## 2020-10-07 LAB — CBC
HCT: 46.8 % (ref 39.0–52.0)
Hemoglobin: 16.7 g/dL (ref 13.0–17.0)
MCH: 32.9 pg (ref 26.0–34.0)
MCHC: 35.7 g/dL (ref 30.0–36.0)
MCV: 92.1 fL (ref 80.0–100.0)
Platelets: 177 10*3/uL (ref 150–400)
RBC: 5.08 MIL/uL (ref 4.22–5.81)
RDW: 11.2 % — ABNORMAL LOW (ref 11.5–15.5)
WBC: 9.4 10*3/uL (ref 4.0–10.5)
nRBC: 0 % (ref 0.0–0.2)

## 2020-10-07 MED ORDER — CLOPIDOGREL BISULFATE 75 MG PO TABS
75.0000 mg | ORAL_TABLET | Freq: Every day | ORAL | 2 refills | Status: DC
  Filled 2020-10-07: qty 90, 90d supply, fill #0

## 2020-10-07 MED ORDER — ATORVASTATIN CALCIUM 80 MG PO TABS
80.0000 mg | ORAL_TABLET | Freq: Every day | ORAL | 1 refills | Status: DC
  Filled 2020-10-07: qty 90, 90d supply, fill #0

## 2020-10-07 MED ORDER — ASPIRIN 81 MG PO CHEW
81.0000 mg | CHEWABLE_TABLET | Freq: Every day | ORAL | 2 refills | Status: DC
  Filled 2020-10-07: qty 90, 90d supply, fill #0

## 2020-10-07 MED ORDER — LISINOPRIL 5 MG PO TABS
5.0000 mg | ORAL_TABLET | Freq: Every day | ORAL | 2 refills | Status: DC
  Filled 2020-10-07: qty 30, 30d supply, fill #0

## 2020-10-07 MED ORDER — AMLODIPINE BESYLATE 5 MG PO TABS
5.0000 mg | ORAL_TABLET | Freq: Every day | ORAL | 2 refills | Status: DC
  Filled 2020-10-07: qty 30, 30d supply, fill #0

## 2020-10-07 MED ORDER — NITROGLYCERIN 0.4 MG SL SUBL
0.4000 mg | SUBLINGUAL_TABLET | SUBLINGUAL | 2 refills | Status: DC | PRN
  Filled 2020-10-07: qty 25, 7d supply, fill #0

## 2020-10-07 NOTE — Discharge Summary (Addendum)
Discharge Summary    Patient ID: Sean Kirk MRN: [id number]; DOB: May 30, 1970  Admit date: 10/05/2020 Discharge date: 10/07/2020  PCP:  Merlene Laughter, MD   [facility] Providers Cardiologist:  Lance Muss, MD      Discharge Diagnoses    Principal Problem:   NSTEMI (non-ST elevated myocardial infarction) [facility]) Active Problems:   Hyperlipidemia   Hypertension   Tobacco use    Diagnostic Studies/Procedures    Cath: 10/06/20    Dist Cx lesion is 99% stenosed.   Post intervention, there is a 0% residual stenosis.   1.  Significant one-vessel coronary artery disease including distal left circumflex supplying a small OM 3 territory.  No other obstructive disease. 2.  Normal LV systolic function mildly elevated left ventricular end-diastolic pressure. 3.  Attempted unsuccessful angioplasty of the distal left circumflex due to inability to cross with a wire.  I was able to cross with a [place] XT but was not into the lumen distally.  I felt that risks of continuing PCI outweigh benefits.   Recommendations: Recommend medical therapy for coronary artery disease and smoking cessation. I started the patient on clopidogrel which should be used for 6 to 12 months post MI.  Echo: 10/07/20  IMPRESSIONS     1. Left ventricular ejection fraction, by estimation, is 60 to 65%. The  left ventricle has normal function. The left ventricle has no regional  wall motion abnormalities. Left ventricular diastolic parameters were  normal.   2. Right ventricular systolic function is normal. The right ventricular  size is normal.   3. The mitral valve is normal in structure. No evidence of mitral valve  regurgitation. No evidence of mitral stenosis.   4. The aortic valve is tricuspid. Aortic valve regurgitation is not  visualized. No aortic stenosis is present.   5. The inferior vena cava is normal in size with greater than 50%  respiratory variability, suggesting right atrial  pressure of 3 mmHg.    History of Present Illness     Sean Kirk is a 50 y.o. male with hx of HLD and NAFLD who was seen 10/06/2020 for the evaluation of NSTEMI.  Sean Kirk has no known history of cardiovascular disease.  He does have a history of hyperlipidemia and nonalcoholic fatty liver disease.   During COVID, Sean Kirk reported he did gain a fair amount of weight and was relatively sedentary.  He also drank heavily for extended periods of time over the last few years, sometimes having 30 drinks per week.  He has also smoked at least 1 pack/day since the age of 75.  Over the last couple months, however, he has tried to get back into the gym and is attempting to be more active.   Sean Kirk was in his normal state of health on a Zoom meeting the afternoon of admission when he suddenly felt like something was very off.  He first noticed a right sided neck pain that radiated up into his jaw.  He then felt a stretching discomfort in the center of his chest.  He attempted to finish his meeting and had one of his colleagues, who is a former IT sales professional, help evaluate his symptoms.  During this period, his symptoms briefly improved, although never went away.  He drove himself home to check his blood pressure, but continued to have the stretching feeling in the center of his chest and decided to come to the emergency department for evaluation.  Symptoms improved in the ED with addition  of nitro gtt.   On presentation to the emergency department, he was hemodynamically stable.  His initial EKG had some very subtle upsloping ST elevations in the inferolateral leads, although this certainly did not meet STEMI criteria.  His initial troponin was elevated at 35 and on recheck was 195.  He was then started on a heparin and nitroglycerin drip.    Hospital Course     NSTEMI: hsTn up to 195. RFs for CAD. Placed on IV heparin and nitro on admission. Nitro gtt weaned, no further episodes of chest pain. Underwent  cardiac cath noted above with significant single vessel disease 99% dLcx with difficult angioplasty 2/2 inability to cross the lesion well with wire. It was felt the risk outweigh the benefits. Placed on DAPT with ASA/plavix for 6 to 12 months. No chest pain post cath. Follow up echo showed normal EF with no rWMA noted.    HLD: does not appear he has been on statin PTA -- LDL 120 -- started on atorvastatin 80mg  daily on admission -- FLP/LFTs in 8 weeks  HTN: blood pressures have been liable. Added lisinopril 5 mg daily at discharge.    Tobacco use: cessation advised  -- he is motivated to quit  Bilateral foot pain: reports intermittent episodes of foot tingling/numbness. No leg pain. Given his hx of tobacco use and now CAD, recommended follow up LE dopplers as an outpatient  General: Well developed, well nourished, male appearing in no acute distress. Head: Normocephalic, atraumatic.  Neck: Supple without bruits, JVD. Lungs:  Resp regular and unlabored, CTA. Heart: RRR, S1, S2, no S3, S4, or murmur; no rub. Abdomen: Soft, non-tender, non-distended with normoactive bowel sounds. No hepatomegaly. No rebound/guarding. No obvious abdominal masses. Extremities: No clubbing, cyanosis, edema. Distal pedal pulses are 2+ bilaterally. Right radial cath site stable without bruising or hematoma Neuro: Alert and oriented X 3. Moves all extremities spontaneously. Psych: Normal affect.  Patient was seen by Dr. and deemed stable for discharge home. Follow up in the office has been arranged. Medications sent to the [facility] pharmacy. Educated by PharmD prior to discharge.   Did the patient have an acute coronary syndrome (MI, NSTEMI, STEMI, etc) this admission?:  Yes                               AHA/ACC Clinical Performance & Quality Measures: Aspirin prescribed? - Yes ADP Receptor Inhibitor (Plavix/Clopidogrel, Brilinta/Ticagrelor or Effient/Prasugrel) prescribed (includes medically managed  patients)? - Yes Beta Blocker prescribed? - No - bradycardia High Intensity Statin (Lipitor 40-80mg  or Crestor 20-40mg ) prescribed? - Yes EF assessed during THIS hospitalization? - Yes For EF <40%, was ACEI/ARB prescribed? - Not Applicable (EF >/= 40%) For EF <40%, Aldosterone Antagonist (Spironolactone or Eplerenone) prescribed? - Not Applicable (EF >/= 40%) Cardiac Rehab Phase II ordered (including medically managed patients)? - Yes      _____________  Discharge Vitals Blood pressure 121/80, pulse (!) 59, temperature 98.8 F (37.1 C), temperature source Oral, resp. rate 16, height 5\' 11"  (1.803 m), weight 108.9 kg, SpO2 98 %.  Filed Weights   10/05/20 2304  Weight: 108.9 kg    Labs & Radiologic Studies    CBC Recent Labs    10/06/20 0653 10/07/20 0237  WBC 9.2 9.4  HGB 16.3 16.7  HCT 45.4 46.8  MCV 93.4 92.1  PLT 184 177   Basic Metabolic Panel Recent Labs    10/08/20 0653 10/07/20 0237  NA 137 138  K 4.0 4.3  CL 105 104  CO2 21* 28  GLUCOSE 108* 108*  BUN 9 11  CREATININE 0.87 1.08  CALCIUM 8.8* 9.0   Liver Function Tests Recent Labs    10/06/20 0653  AST 60*  ALT 38  ALKPHOS 55  BILITOT 1.1  PROT 6.4*  ALBUMIN 3.6   No results for input(s): LIPASE, AMYLASE in the last 72 hours. High Sensitivity Troponin:   Recent Labs  Lab 10/05/20 1746 10/05/20 2115  TROPONINIHS 39* 195*    BNP Invalid input(s): POCBNP D-Dimer No results for input(s): DDIMER in the last 72 hours. Hemoglobin A1C Recent Labs    10/06/20 0653  HGBA1C 5.2   Fasting Lipid Panel Recent Labs    10/06/20 0653  CHOL 178  HDL 34*  LDLCALC 120*  TRIG 121  CHOLHDL 5.2   Thyroid Function Tests Recent Labs    10/06/20 0653  TSH 2.504   _____________  DG Chest 2 View  Result Date: 10/05/2020 CLINICAL DATA:  Chest pressure. EXAM: CHEST - 2 VIEW COMPARISON:  October 15, 2012 FINDINGS: The heart size and mediastinal contours are within normal limits. Both lungs are  clear. Degenerative changes seen throughout the thoracic spine. IMPRESSION: No active cardiopulmonary disease. Electronically Signed   By: Aram Candela M.D.   On: 10/05/2020 18:19   CARDIAC CATHETERIZATION  Result Date: 10/06/2020   Dist Cx lesion is 99% stenosed.   Post intervention, there is a 0% residual stenosis. 1.  Significant one-vessel coronary artery disease including distal left circumflex supplying a small OM 3 territory.  No other obstructive disease. 2.  Normal LV systolic function mildly elevated left ventricular end-diastolic pressure. 3.  Attempted unsuccessful angioplasty of the distal left circumflex due to inability to cross with a wire.  I was able to cross with a [place] XT but was not into the lumen distally.  I felt that risks of continuing PCI outweigh benefits. Recommendations: Recommend medical therapy for coronary artery disease and smoking cessation. I started the patient on clopidogrel which should be used for 6 to 12 months post MI.   ECHOCARDIOGRAM COMPLETE  Result Date: 10/07/2020    ECHOCARDIOGRAM REPORT   Patient Name:   Sean Kirk Date of Exam: 10/07/2020 Medical Rec #:  [id number]     Height:       71.0 in Accession #:    [id number]    Weight:       240.0 lb Date of Birth:  November 30, 1970    BSA:          2.278 m Patient Age:    49 years      BP:           121/80 mmHg Patient Gender: M             HR:           86 bpm. Exam Location:  Inpatient Procedure: 2D Echo, Cardiac Doppler and Color Doppler Indications:    NSTEMI  History:        Patient has no prior history of Echocardiogram examinations.                 Acute MI.  Sonographer:    MH Referring Phys: [id number] CHRISTOPHER A WROBEL IMPRESSIONS  1. Left ventricular ejection fraction, by estimation, is 60 to 65%. The left ventricle has normal function. The left ventricle has no regional wall motion abnormalities. Left ventricular diastolic parameters were normal.  2. Right  ventricular systolic function is normal. The  right ventricular size is normal.  3. The mitral valve is normal in structure. No evidence of mitral valve regurgitation. No evidence of mitral stenosis.  4. The aortic valve is tricuspid. Aortic valve regurgitation is not visualized. No aortic stenosis is present.  5. The inferior vena cava is normal in size with greater than 50% respiratory variability, suggesting right atrial pressure of 3 mmHg. FINDINGS  Left Ventricle: Left ventricular ejection fraction, by estimation, is 60 to 65%. The left ventricle has normal function. The left ventricle has no regional wall motion abnormalities. The left ventricular internal cavity size was normal in size. There is  no left ventricular hypertrophy. Left ventricular diastolic parameters were normal. Right Ventricle: The right ventricular size is normal. Right ventricular systolic function is normal. Left Atrium: Left atrial size was normal in size. Right Atrium: Right atrial size was normal in size. Pericardium: There is no evidence of pericardial effusion. Mitral Valve: The mitral valve is normal in structure. No evidence of mitral valve regurgitation. No evidence of mitral valve stenosis. Tricuspid Valve: The tricuspid valve is normal in structure. Tricuspid valve regurgitation is trivial. No evidence of tricuspid stenosis. Aortic Valve: The aortic valve is tricuspid. Aortic valve regurgitation is not visualized. No aortic stenosis is present. Aortic valve mean gradient measures 3.0 mmHg. Aortic valve peak gradient measures 4.7 mmHg. Aortic valve area, by VTI measures 4.00 cm. Pulmonic Valve: The pulmonic valve was normal in structure. Pulmonic valve regurgitation is not visualized. No evidence of pulmonic stenosis. Aorta: The aortic root is normal in size and structure. Venous: The inferior vena cava is normal in size with greater than 50% respiratory variability, suggesting right atrial pressure of 3 mmHg. IAS/Shunts: No atrial level shunt detected by color flow  Doppler.  LEFT VENTRICLE PLAX 2D LVIDd:         3.70 cm     Diastology LVIDs:         2.60 cm     LV e' medial:    8.59 cm/s LV PW:         1.00 cm     LV E/e' medial:  6.8 LV IVS:        1.10 cm     LV e' lateral:   15.10 cm/s LVOT diam:     2.50 cm     LV E/e' lateral: 3.9 LV SV:         113 LV SV Index:   50 LVOT Area:     4.91 cm  LV Volumes (MOD) LV vol d, MOD A4C: 56.3 ml LV vol s, MOD A4C: 23.7 ml LV SV MOD A4C:     56.3 ml RIGHT VENTRICLE             IVC RV S prime:     13.30 cm/s  IVC diam: 1.50 cm TAPSE (M-mode): 2.8 cm LEFT ATRIUM             Index       RIGHT ATRIUM           Index LA diam:        3.10 cm 1.36 cm/m  RA Area:     18.80 cm LA Vol (A2C):   42.7 ml 18.74 ml/m RA Volume:   46.90 ml  20.58 ml/m LA Vol (A4C):   42.5 ml 18.65 ml/m LA Biplane Vol: 42.8 ml 18.78 ml/m  AORTIC VALVE  PULMONIC VALVE AV Area (Vmax):    4.91 cm    PV Vmax:       0.73 m/s AV Area (Vmean):   3.89 cm    PV Peak grad:  2.1 mmHg AV Area (VTI):     4.00 cm AV Vmax:           108.00 cm/s AV Vmean:          91.500 cm/s AV VTI:            0.282 m AV Peak Grad:      4.7 mmHg AV Mean Grad:      3.0 mmHg LVOT Vmax:         108.00 cm/s LVOT Vmean:        72.600 cm/s LVOT VTI:          0.230 m LVOT/AV VTI ratio: 0.82  AORTA Ao Root diam: 3.20 cm Ao Asc diam:  3.50 cm MITRAL VALVE MV Area (PHT): 3.01 cm    SHUNTS MV E velocity: 58.70 cm/s  Systemic VTI:  0.23 m MV A velocity: 45.00 cm/s  Systemic Diam: 2.50 cm MV E/A ratio:  1.30 Olga Millers MD Electronically signed by Olga Millers MD Signature Date/Time: 10/07/2020/12:09:55 PM    Final    Disposition   Pt is being discharged home today in good condition.  Follow-up Plans & Appointments     Follow-up Information     Corky Crafts, MD Follow up on 10/27/2020.   Specialties: Cardiology, Radiology, Interventional Cardiology Why: at 1:30pm for your follow up appt Contact information: [address] [place]  [address] [phone]                Discharge Instructions     Amb Referral to Cardiac Rehabilitation   Complete by: As directed    Diagnosis: NSTEMI   After initial evaluation and assessments completed: Virtual Based Care may be provided alone or in conjunction with Phase 2 Cardiac Rehab based on patient barriers.: Yes   Call MD for:  redness, tenderness, or signs of infection (pain, swelling, redness, odor or green/yellow discharge around incision site)   Complete by: As directed    Diet - low sodium heart healthy   Complete by: As directed    Discharge instructions   Complete by: As directed    Radial Site Care Refer to this sheet in the next few weeks. These instructions provide you with information on caring for yourself after your procedure. Your caregiver may also give you more specific instructions. Your treatment has been planned according to current medical practices, but problems sometimes occur. Call your caregiver if you have any problems or questions after your procedure. HOME CARE INSTRUCTIONS You may shower the day after the procedure. Remove the bandage (dressing) and gently wash the site with plain soap and water. Gently pat the site dry.  Do not apply powder or lotion to the site.  Do not submerge the affected site in water for 3 to 5 days.  Inspect the site at least twice daily.  Do not flex or bend the affected arm for 24 hours.  No lifting over 5 pounds (2.3 kg) for 5 days after your procedure.  Do not drive home if you are discharged the same day of the procedure. Have someone else drive you.  You may drive 24 hours after the procedure unless otherwise instructed by your caregiver.  What to expect: Any bruising will usually fade within 1  to 2 weeks.  Blood that collects in the tissue (hematoma) may be painful to the touch. It should usually decrease in size and tenderness within 1 to 2 weeks.  SEEK IMMEDIATE MEDICAL CARE IF: You have unusual pain at the  radial site.  You have redness, warmth, swelling, or pain at the radial site.  You have drainage (other than a small amount of blood on the dressing).  You have chills.  You have a fever or persistent symptoms for more than 72 hours.  You have a fever and your symptoms suddenly get worse.  Your arm becomes pale, cool, tingly, or numb.  You have heavy bleeding from the site. Hold pressure on the site.   Increase activity slowly   Complete by: As directed        Discharge Medications   Allergies as of 10/07/2020   No Known Allergies      Medication List     TAKE these medications    Aspirin Low Dose 81 MG chewable tablet Generic drug: aspirin Chew 1 tablet (81 mg total) by mouth daily.   atorvastatin 80 MG tablet Commonly known as: LIPITOR Take 1 tablet (80 mg total) by mouth daily.   cetirizine 10 MG tablet Commonly known as: ZYRTEC Take 10 mg by mouth daily as needed for allergies.   clopidogrel 75 MG tablet Commonly known as: PLAVIX Take 1 tablet (75 mg total) by mouth daily with breakfast.   fluticasone 50 MCG/ACT nasal spray Commonly known as: FLONASE Place 1 spray into both nostrils daily as needed for allergies or rhinitis.   lisinopril 5 MG tablet Commonly known as: ZESTRIL Take 1 tablet (5 mg total) by mouth daily.   nitroGLYCERIN 0.4 MG SL tablet Commonly known as: NITROSTAT Place 1 tablet (0.4 mg total) under the tongue every 5 (five) minutes x 3 doses as needed for chest pain.           Outstanding Labs/Studies   FLP/LFTs in 8 weeks  Duration of Discharge Encounter   Greater than 30 minutes including physician time.  Signed, Laverda Page, NP 10/07/2020, 12:13 PM  I have examined the patient and reviewed assessment and plan and discussed with patient.  Agree with above as stated.    CAD/ NSTEMI: Distal circumflex subtotally occluded.  Unable to revascularize.  He is not having angina.  Continue with aggressive secondary prevention.   LVEF is preserved.  No signs of heart failure.  Plan to discharge today on: Aspirin 81 mg daily Clopidogrel 75 mg daily Atorvastatin 80 mg daily Lisinopril 5 mg daily   No beta-blocker due to bradycardia.  Lisinopril should help with some of the blood pressure spikes that he was having.   We spoke about the importance of plant-based, high-fiber diet.  Avoid processed foods.  He needs regular exercise as well.  Cardiac rehab will be helpful for him.  Lance Muss

## 2020-10-07 NOTE — Progress Notes (Signed)
CARDIAC REHAB PHASE I   PRE:  Rate/Rhythm: 64 SR  BP:  Supine:   Sitting: 121/80  Standing:    SaO2: 97%RA  MODE:  Ambulation: 700 ft   POST:  Rate/Rhythm: 81 SR  BP:  Supine:   Sitting: 134/91  Standing:    SaO2: 98%RA 1003-1100 Pt walked 700 ft on RA with steady gait and tolerated well. No CP. MI education completed with pt who voiced understanding. Stressed importance of taking meds especially plavix. Reviewed NTG use, MI restrictions, walking for ex, heart healthy food choices, smoking cessation and CRP 2. Gave smoking cessation handout. Pt encouraged to call 1800quitnow. He stated he is determined to quit. Referred to [place]. Pt interested in attending.    Luetta Nutting, RN BSN  10/07/2020 10:54 AM

## 2020-10-07 NOTE — Progress Notes (Signed)
Progress Note  Patient Name: Sean Kirk Date of Encounter: 10/07/2020  [facility] Cardiologist: Lance Muss, MD   Subjective   No chest pain.  Inpatient Medications    Scheduled Meds:  aspirin  81 mg Oral Daily   atorvastatin  80 mg Oral Daily   clopidogrel  75 mg Oral Q breakfast   nicotine  21 mg Transdermal Daily   sodium chloride flush  3 mL Intravenous Q12H   sodium chloride flush  3 mL Intravenous Q12H   Continuous Infusions:  sodium chloride     nitroGLYCERIN Stopped (10/06/20 0303)   PRN Meds: sodium chloride, acetaminophen, nicotine polacrilex, nitroGLYCERIN, ondansetron (ZOFRAN) IV, sodium chloride flush   Vital Signs    Vitals:   10/06/20 2100 10/06/20 2200 10/07/20 0610 10/07/20 0856  BP: 128/84 (!) 112/91 128/88 121/80  Pulse: 69 (!) 55 60 (!) 59  Resp: 18 12 15 16   Temp:   98.5 F (36.9 C) 98.8 F (37.1 C)  TempSrc:   Oral Oral  SpO2: 97% 98% 98% 98%  Weight:      Height:        Intake/Output Summary (Last 24 hours) at 10/07/2020 1117 Last data filed at 10/06/2020 2200 Gross per 24 hour  Intake 973.84 ml  Output --  Net 973.84 ml   Last 3 Weights 10/05/2020  Weight (lbs) 240 lb  Weight (kg) 108.863 kg      Telemetry    Normal sinus rhythm- Personally Reviewed  ECG      Physical Exam   GEN: No acute distress.   Neck: No JVD Cardiac: RRR, no murmurs, rubs, or gallops.  Respiratory: Clear to auscultation bilaterally. GI: Soft, nontender, non-distended  MS: No edema; No deformity.  No radial hematoma Neuro:  Nonfocal  Psych: Normal affect   Labs    High Sensitivity Troponin:   Recent Labs  Lab 10/05/20 1746 10/05/20 2115  TROPONINIHS 39* 195*     Chemistry Recent Labs  Lab 10/05/20 1746 10/06/20 0653 10/07/20 0237  NA 137 137 138  K 3.7 4.0 4.3  CL 102 105 104  CO2 24 21* 28  GLUCOSE 110* 108* 108*  BUN 11 9 11   CREATININE 0.92 0.87 1.08  CALCIUM 9.1 8.8* 9.0  PROT  --  6.4*  --   ALBUMIN  --   3.6  --   AST  --  60*  --   ALT  --  38  --   ALKPHOS  --  55  --   BILITOT  --  1.1  --   GFRNONAA >60 >60 >60  ANIONGAP 11 11 6     Lipids  Recent Labs  Lab 10/06/20 0653  CHOL 178  TRIG 121  HDL 34*  LDLCALC 120*  CHOLHDL 5.2    Hematology Recent Labs  Lab 10/05/20 1746 10/06/20 0653 10/07/20 0237  WBC 10.8* 9.2 9.4  RBC 5.27 4.86 5.08  HGB 17.5* 16.3 16.7  HCT 49.1 45.4 46.8  MCV 93.2 93.4 92.1  MCH 33.2 33.5 32.9  MCHC 35.6 35.9 35.7  RDW 11.3* 11.4* 11.2*  PLT 202 184 177   Thyroid  Recent Labs  Lab 10/06/20 0653  TSH 2.504  FREET4 1.13*    BNP Recent Labs  Lab 10/06/20 0653  BNP 64.8    DDimer No results for input(s): DDIMER in the last 168 hours.   Radiology    DG Chest 2 View  Result Date: 10/05/2020 CLINICAL DATA:  Chest pressure. EXAM:  CHEST - 2 VIEW COMPARISON:  October 15, 2012 FINDINGS: The heart size and mediastinal contours are within normal limits. Both lungs are clear. Degenerative changes seen throughout the thoracic spine. IMPRESSION: No active cardiopulmonary disease. Electronically Signed   By: Aram Candela M.D.   On: 10/05/2020 18:19   CARDIAC CATHETERIZATION  Result Date: 10/06/2020   Dist Cx lesion is 99% stenosed.   Post intervention, there is a 0% residual stenosis. 1.  Significant one-vessel coronary artery disease including distal left circumflex supplying a small OM 3 territory.  No other obstructive disease. 2.  Normal LV systolic function mildly elevated left ventricular end-diastolic pressure. 3.  Attempted unsuccessful angioplasty of the distal left circumflex due to inability to cross with a wire.  I was able to cross with a [place] XT but was not into the lumen distally.  I felt that risks of continuing PCI outweigh benefits. Recommendations: Recommend medical therapy for coronary artery disease and smoking cessation. I started the patient on clopidogrel which should be used for 6 to 12 months post MI.    Cardiac  Studies   Cath films reviewed  Patient Profile     50 y.o. male with small NSTEMI  Assessment & Plan    CAD/ NSTEMI: Distal circumflex subtotally occluded.  Unable to revascularize.  He is not having angina.  Continue with aggressive secondary prevention.  LVEF is preserved.  No signs of heart failure.  Plan to discharge today on: Aspirin 81 mg daily Clopidogrel 75 mg daily Atorvastatin 80 mg daily Lisinopril 5 mg daily  No beta-blocker due to bradycardia.  Lisinopril should help with some of the blood pressure spikes that he was having.  We spoke about the importance of plant-based, high-fiber diet.  Avoid processed foods.  He needs regular exercise as well.  Cardiac rehab will be helpful for him.     For questions or updates, please contact [facility] Please consult [email or web address] for contact info under        Signed, Lance Muss, MD  10/07/2020, 11:17 AM

## 2020-10-07 NOTE — Plan of Care (Signed)

## 2020-10-13 ENCOUNTER — Other Ambulatory Visit: Payer: Self-pay

## 2020-10-13 DIAGNOSIS — Z006 Encounter for examination for normal comparison and control in clinical research program: Secondary | ICD-10-CM

## 2020-10-13 NOTE — Research (Signed)
Subject # [id number] [employer] Lp(a) [id number] Site # [phone]  SEX [x]    Male                      []    Male  Ethnicity []   Hispanic or Latino   [x]   Not Hispanic or Latino  Race [x]   White                 []   Black or African American  []   Asian []   American [place] or [place] Native            []   Native Hawaiian or Other Pacific Islander                      []   Other  Other   Age 50  Subject Group [x]    Local Lab           []   Historical Lp(a) value                                       Results:  Future research [x]    Yes                    []   No    Amgen [id number] Site # [id number] Subject ID # [id number]         ELIGIBILITY CRITERIA WORKSHEET INCLUSION CRITERIA   Subject has provided informed consent prior to the initiation of any study specific activities/procedures [x]   Age 61 to 24 years [x]   MI (presumed type 1) OR [x]   PCI (with high-risk features) with at least 1 of the following: []   Age >63 []   Diabetes mellitus  HbA1c: []   History of ischemic stroke []   History of peripheral arterial disease []   Residual stenosis ? 50% []   Multivessel PCI (ie, ? 2 vessels, including branch arteries []   EXCLUSIONS THE FOLLOWING N/A [x]   Subjects known to be currently receiving investigational drug in a clinical study that is anticipated to last > 1 year []   Known Lp(a) value 90mg /dL or < 200nmol/L []   Subject has a diagnosis of end-stage renal disease or requires dialysis. []   Poorly controlled (glycated hemoglobin [HbA1c] > 10%) diabetes mellitus (type 1 or type 2) []   Subject is receiving or has received lipoprotein apheresis to reduce Lp(a) within 3 months prior to enrollment. []   Known uncontrolled or recurrent ventricular tachycardia in the past 3 months prior to enrollment. []   Known malignancy (except non-melanoma skin cancers, cervical in situ carcinoma, breast ductal carcinoma in situ, or stage 1 prostate carcinoma) within the last 5 years prior to enrollment. []   Known  history or evidence of clinically significant disease (eg, respiratory, gastrointestinal, or psychiatric disease) or unstable disorder or biomarker that, in the opinion of the investigator(s), would result in life expectancy < 5 years. []   Known hemorrhagic stroke. []     [employer] Lp(a) Informed Consent   Subject Name: Sean Kirk  Subject met inclusion and exclusion criteria.  The informed consent form, study requirements and expectations were reviewed with the subject and questions and concerns were addressed prior to the signing of the consent form.  The subject verbalized understanding of the trial requirements.  The subject agreed to participate in the [facility] Lp(a) trial and signed the informed consent at 938 on 10/13/20.  The informed consent was obtained prior to  performance of any protocol-specific procedures for the subject.  A copy of the signed informed consent was given to the subject and a copy was placed in the subject's medical record.   Chanda Busing  [employer] Consent Version 2 Protocol Version 2

## 2020-10-14 ENCOUNTER — Telehealth (HOSPITAL_COMMUNITY): Payer: Self-pay

## 2020-10-14 LAB — LIPOPROTEIN A (LPA): Lipoprotein (a): 160.4 nmol/L — ABNORMAL HIGH (ref <75.0)

## 2020-10-14 NOTE — Telephone Encounter (Signed)
Called pt to see if he is interested in the cardiac rehab, he stated that he was not interested at this time. Closed referral.

## 2020-10-18 DIAGNOSIS — Z0279 Encounter for issue of other medical certificate: Secondary | ICD-10-CM

## 2020-10-19 ENCOUNTER — Telehealth: Payer: Self-pay | Admitting: Interventional Cardiology

## 2020-10-19 NOTE — Telephone Encounter (Signed)
Forms form Sean Kirk received on 10/18/2020. Completed patient auth attached. Took form to Dr. Eldridge Dace box for completion. JMM 10/19/2020

## 2020-10-21 ENCOUNTER — Telehealth: Payer: Self-pay | Admitting: *Deleted

## 2020-10-21 DIAGNOSIS — Z006 Encounter for examination for normal comparison and control in clinical research program: Secondary | ICD-10-CM

## 2020-10-21 NOTE — Telephone Encounter (Signed)
Spoke with patient about participating in Clinical Research Study V-Inception.  Patient was emailed a copy of consent.  Will Follow-up with the patient in a few days.

## 2020-10-25 ENCOUNTER — Other Ambulatory Visit (HOSPITAL_BASED_OUTPATIENT_CLINIC_OR_DEPARTMENT_OTHER): Payer: Self-pay

## 2020-10-25 ENCOUNTER — Telehealth: Payer: Self-pay | Admitting: Interventional Cardiology

## 2020-10-25 NOTE — Telephone Encounter (Signed)
Form completed by Dr Eldridge Dace and faxed to [facility] on 10/25/2020. Patient notified and ready for pick up. JMM 10/25/2020

## 2020-10-26 NOTE — Progress Notes (Signed)
Cardiology Office Note   Date:  10/27/2020   ID:  Sean Kirk, DOB 06-Mar-1970, MRN [id number]  PCP:  Merlene Laughter, MD    No chief complaint on file.  CAD  Wt Readings from Last 3 Encounters:  10/27/20 228 lb 6.4 oz (103.6 kg)  10/05/20 240 lb (108.9 kg)       History of Present Illness: Sean Kirk is a 50 y.o. male  with CAD/NSTEMI in 09/2020.  Records show: "During COVID, [name] reported he did gain a fair amount of weight and was relatively sedentary.  He also drank heavily for extended periods of time over the last few years, sometimes having 30 drinks per week.  He has also smoked at least 1 pack/day since the age of 30.  Over the last couple months, however, he has tried to get back into the gym and is attempting to be more active."   In 09/2020, "[name] was in his normal state of health on a Zoom meeting the afternoon of admission when he suddenly felt like something was very off.  He first noticed a right sided neck pain that radiated up into his jaw. "  Cath showed: "Cath: 10/06/20     Dist Cx lesion is 99% stenosed.   Post intervention, there is a 0% residual stenosis.   1.  Significant one-vessel coronary artery disease including distal left circumflex supplying a small OM 3 territory.  No other obstructive disease. 2.  Normal LV systolic function mildly elevated left ventricular end-diastolic pressure. 3.  Attempted unsuccessful angioplasty of the distal left circumflex due to inability to cross with a wire.  I was able to cross with a [place] XT but was not into the lumen distally.  I felt that risks of continuing PCI outweigh benefits.   Recommendations: Recommend medical therapy for coronary artery disease and smoking cessation. I started the patient on clopidogrel which should be used for 6 to 12 months post MI.   Echo: 10/07/20   IMPRESSIONS     1. Left ventricular ejection fraction, by estimation, is 60 to 65%. The  left ventricle has  normal function. The left ventricle has no regional  wall motion abnormalities. Left ventricular diastolic parameters were  normal.   2. Right ventricular systolic function is normal. The right ventricular  size is normal.   3. The mitral valve is normal in structure. No evidence of mitral valve  regurgitation. No evidence of mitral stenosis.   4. The aortic valve is tricuspid. Aortic valve regurgitation is not  visualized. No aortic stenosis is present.   5. The inferior vena cava is normal in size with greater than 50%  respiratory variability, suggesting right atrial pressure of 3 mmHg. "  At times, he has some chest wall tenderness.  No exertional chest pain. Walking for exercise.  GOing up 20 degree incline, 20 minutes at a time.   Denies : exertional Chest pain. Dizziness. Leg edema. Nitroglycerin use. Orthopnea. Palpitations. Paroxysmal nocturnal dyspnea. Shortness of breath. Syncope.    Quit smoking with lozenges.  Eating better.   No past medical history on file.  Past Surgical History:  Procedure Laterality Date   CORONARY BALLOON ANGIOPLASTY N/A 10/06/2020   Procedure: CORONARY BALLOON ANGIOPLASTY;  Surgeon: Iran Ouch, MD;  Location: [facility];  Service: Cardiovascular;  Laterality: N/A;   LEFT HEART CATH AND CORONARY ANGIOGRAPHY N/A 10/06/2020   Procedure: LEFT HEART CATH AND CORONARY ANGIOGRAPHY;  Surgeon: Iran Ouch,  MD;  Location: [facility] INVASIVE CV LAB;  Service: Cardiovascular;  Laterality: N/A;     Current Outpatient Medications  Medication Sig Dispense Refill   aspirin 81 MG chewable tablet Chew 1 tablet (81 mg total) by mouth daily. 90 tablet 2   atorvastatin (LIPITOR) 80 MG tablet Take 1 tablet (80 mg total) by mouth daily. 90 tablet 1   cetirizine (ZYRTEC) 10 MG tablet Take 10 mg by mouth daily as needed for allergies.     clopidogrel (PLAVIX) 75 MG tablet Take 1 tablet (75 mg total) by mouth daily with breakfast. 90 tablet 2   fluticasone  (FLONASE) 50 MCG/ACT nasal spray Place 1 spray into both nostrils daily as needed for allergies or rhinitis.     lisinopril (ZESTRIL) 5 MG tablet Take 1 tablet (5 mg total) by mouth daily. 30 tablet 2   nitroGLYCERIN (NITROSTAT) 0.4 MG SL tablet Place 1 tablet (0.4 mg total) under the tongue every 5 (five) minutes x 3 doses as needed for chest pain. 25 tablet 2   No current facility-administered medications for this visit.    Allergies:   Patient has no known allergies.    Social History:  The patient  reports that he has been smoking cigarettes. He has been smoking an average of 1.5 packs per day. He has never used smokeless tobacco. He reports current alcohol use. He reports current drug use. Drug: Marijuana.   Family History:  The patient's family history includes CAD in his maternal grandmother and maternal uncle.    ROS:  Please see the history of present illness.   Otherwise, review of systems are positive for somewhat anxious.   All other systems are reviewed and negative.    PHYSICAL EXAM: VS:  BP 122/80   Pulse (!) 54   Ht 5\' 11"  (1.803 m)   Wt 228 lb 6.4 oz (103.6 kg)   SpO2 97%   BMI 31.86 kg/m  , BMI Body mass index is 31.86 kg/m. GEN: Well nourished, well developed, in no acute distress HEENT: normal Neck: no JVD, carotid bruits, or masses Cardiac: RRR; no murmurs, rubs, or gallops,no edema  Respiratory:  clear to auscultation bilaterally, normal work of breathing GI: soft, nontender, nondistended, + BS MS: no deformity or atrophy Skin: warm and dry, no rash Neuro:  Strength and sensation are intact Psych: euthymic mood, full affect   EKG:   The ekg ordered today demonstrates NSR, no ST changes   Recent Labs: 10/06/2020: ALT 38; B Natriuretic Peptide 64.8; TSH 2.504 10/07/2020: BUN 11; Creatinine, Ser 1.08; Hemoglobin 16.7; Platelets 177; Potassium 4.3; Sodium 138   Lipid Panel    Component Value Date/Time   CHOL 178 10/06/2020 0653   TRIG 121 10/06/2020  0653   HDL 34 (L) 10/06/2020 0653   CHOLHDL 5.2 10/06/2020 0653   VLDL 24 10/06/2020 0653   LDLCALC 120 (H) 10/06/2020 10/08/2020     Other studies Reviewed: Additional studies/ records that were reviewed today with results demonstrating: hospital records.   ASSESSMENT AND PLAN:  CAD/Old MI: No exertional angina. COntinue aggressive secondary prevention.  BP controlled at home in the 115/70 range.  Avoid processed food.  Low salt diet. Hyperlipidemia: LDL 120 in 09/2020.  Needs high dose statin.  TOlerating atorvastatin.  Whole food, plant-based diet.  He has done well losing weight since leaving the hospital.  Needs labs rechecked in a couple months. Tobacco abuse: So far has quit smoking.  Losing weight intentionally as well.  Anxiety: Used paxil in the past per Dr. Pete Glatter. He wants to consider Xanax or something like that.  I discouraged the use of something that may be habit-forming.  He will discuss further with Dr. Pete Glatter.    Current medicines are reviewed at length with the patient today.  The patient concerns regarding his medicines were addressed.  The following changes have been made:  No change  Labs/ tests ordered today include:  No orders of the defined types were placed in this encounter.   Recommend 150 minutes/week of aerobic exercise Low fat, low carb, high fiber diet recommended  Disposition:   FU in 6 months.   Signed, Lance Muss, MD  10/27/2020 1:20 PM    [facility] [address] [place], [place], [place]  [address] Phone: [phone]; Fax: [phone]

## 2020-10-27 ENCOUNTER — Ambulatory Visit: Payer: BC Managed Care – PPO | Admitting: Interventional Cardiology

## 2020-10-27 ENCOUNTER — Other Ambulatory Visit: Payer: Self-pay

## 2020-10-27 ENCOUNTER — Encounter: Payer: Self-pay | Admitting: Interventional Cardiology

## 2020-10-27 ENCOUNTER — Encounter: Payer: Self-pay | Admitting: *Deleted

## 2020-10-27 VITALS — BP 122/80 | HR 54 | Ht 71.0 in | Wt 228.4 lb

## 2020-10-27 DIAGNOSIS — E782 Mixed hyperlipidemia: Secondary | ICD-10-CM | POA: Diagnosis not present

## 2020-10-27 DIAGNOSIS — I25118 Atherosclerotic heart disease of native coronary artery with other forms of angina pectoris: Secondary | ICD-10-CM

## 2020-10-27 DIAGNOSIS — Z720 Tobacco use: Secondary | ICD-10-CM

## 2020-10-27 DIAGNOSIS — I252 Old myocardial infarction: Secondary | ICD-10-CM

## 2020-10-27 NOTE — Patient Instructions (Signed)
Medication Instructions:  Your physician recommends that you continue on your current medications as directed. Please refer to the Current Medication list given to you today.  *If you need a refill on your cardiac medications before your next appointment, please call your pharmacy*   Lab Work: Your physician recommends that you return for lab work  on December 03, 2020.  CMET and Lipid profile.  The lab opens at 7:30 AM  If you have labs (blood work) drawn today and your tests are completely normal, you will receive your results only by: MyChart Message (if you have MyChart) OR A paper copy in the mail If you have any lab test that is abnormal or we need to change your treatment, we will call you to review the results.   Testing/Procedures: none   Follow-Up: At [facility], you and your health needs are our priority.  As part of our continuing mission to provide you with exceptional heart care, we have created designated Provider Care Teams.  These Care Teams include your primary Cardiologist (physician) and Advanced Practice Providers (APPs -  Physician Assistants and Nurse Practitioners) who all work together to provide you with the care you need, when you need it.  We recommend signing up for the patient portal called "MyChart".  Sign up information is provided on this After Visit Summary.  MyChart is used to connect with patients for Virtual Visits (Telemedicine).  Patients are able to view lab/test results, encounter notes, upcoming appointments, etc.  Non-urgent messages can be sent to your provider as well.   To learn more about what you can do with MyChart, go to [email or web address].    Your next appointment:   6 month(s)  The format for your next appointment:   In Person  Provider:   You may see Lance Muss, MD or one of the following Advanced Practice Providers on your designated Care Team:   Ronie Spies, PA-C Jacolyn Reedy, PA-C   Other Instructions

## 2020-10-29 ENCOUNTER — Telehealth: Payer: Self-pay | Admitting: *Deleted

## 2020-10-29 DIAGNOSIS — Z006 Encounter for examination for normal comparison and control in clinical research program: Secondary | ICD-10-CM

## 2020-10-29 NOTE — Telephone Encounter (Signed)
Called patient to discuss his participation in research study v-inception.  Patient declined wanting to participate and wants to see how his atorvastatin works for him first

## 2020-11-01 ENCOUNTER — Other Ambulatory Visit (HOSPITAL_COMMUNITY): Payer: Self-pay

## 2020-11-02 ENCOUNTER — Other Ambulatory Visit: Payer: Self-pay | Admitting: Cardiology

## 2020-12-03 ENCOUNTER — Other Ambulatory Visit: Payer: BC Managed Care – PPO | Admitting: *Deleted

## 2020-12-03 ENCOUNTER — Other Ambulatory Visit: Payer: Self-pay

## 2020-12-03 DIAGNOSIS — E782 Mixed hyperlipidemia: Secondary | ICD-10-CM

## 2020-12-03 DIAGNOSIS — I25118 Atherosclerotic heart disease of native coronary artery with other forms of angina pectoris: Secondary | ICD-10-CM

## 2020-12-03 LAB — LIPID PANEL
Chol/HDL Ratio: 2.9 ratio (ref 0.0–5.0)
Cholesterol, Total: 112 mg/dL (ref 100–199)
HDL: 38 mg/dL — ABNORMAL LOW (ref >39)
LDL Chol Calc (NIH): 52 mg/dL (ref 0–99)
Triglycerides: 119 mg/dL (ref 0–149)
VLDL Cholesterol Cal: 22 mg/dL (ref 5–40)

## 2020-12-03 LAB — COMPREHENSIVE METABOLIC PANEL
ALT: 29 IU/L (ref 0–44)
AST: 28 IU/L (ref 0–40)
Albumin/Globulin Ratio: 2.2 (ref 1.2–2.2)
Albumin: 4.6 g/dL (ref 4.0–5.0)
Alkaline Phosphatase: 81 IU/L (ref 44–121)
BUN/Creatinine Ratio: 10 (ref 9–20)
BUN: 9 mg/dL (ref 6–24)
Bilirubin Total: 0.8 mg/dL (ref 0.0–1.2)
CO2: 24 mmol/L (ref 20–29)
Calcium: 9.3 mg/dL (ref 8.7–10.2)
Chloride: 101 mmol/L (ref 96–106)
Creatinine, Ser: 0.94 mg/dL (ref 0.76–1.27)
Globulin, Total: 2.1 g/dL (ref 1.5–4.5)
Glucose: 112 mg/dL — ABNORMAL HIGH (ref 70–99)
Potassium: 4.4 mmol/L (ref 3.5–5.2)
Sodium: 139 mmol/L (ref 134–144)
Total Protein: 6.7 g/dL (ref 6.0–8.5)
eGFR: 101 mL/min/{1.73_m2} (ref >59)

## 2021-01-05 ENCOUNTER — Other Ambulatory Visit (HOSPITAL_COMMUNITY): Payer: Self-pay

## 2021-03-15 ENCOUNTER — Telehealth: Payer: Self-pay | Admitting: Interventional Cardiology

## 2021-03-15 NOTE — Telephone Encounter (Signed)
Patient is requesting a provider switch to Dr. Anne Fu. States he was assigned Dr. Eldridge Dace in the hospital, but would prefer to see Dr. Anne Fu due to his friends seeing him. Please advise.

## 2021-03-15 NOTE — Telephone Encounter (Signed)
OK with me. THanks

## 2021-03-21 ENCOUNTER — Encounter: Payer: Self-pay | Admitting: Cardiology

## 2021-03-21 NOTE — Telephone Encounter (Signed)
Pt has been scheduled with Dr Marlou Porch 03/22/21. ?

## 2021-03-22 ENCOUNTER — Other Ambulatory Visit: Payer: Self-pay

## 2021-03-22 ENCOUNTER — Ambulatory Visit: Payer: BC Managed Care – PPO | Admitting: Cardiology

## 2021-03-22 ENCOUNTER — Encounter: Payer: Self-pay | Admitting: Cardiology

## 2021-03-22 VITALS — BP 120/70 | HR 73 | Ht 71.5 in | Wt 215.2 lb

## 2021-03-22 DIAGNOSIS — I10 Essential (primary) hypertension: Secondary | ICD-10-CM

## 2021-03-22 DIAGNOSIS — Z006 Encounter for examination for normal comparison and control in clinical research program: Secondary | ICD-10-CM

## 2021-03-22 DIAGNOSIS — E782 Mixed hyperlipidemia: Secondary | ICD-10-CM

## 2021-03-22 DIAGNOSIS — I25118 Atherosclerotic heart disease of native coronary artery with other forms of angina pectoris: Secondary | ICD-10-CM | POA: Diagnosis not present

## 2021-03-22 DIAGNOSIS — Z87891 Personal history of nicotine dependence: Secondary | ICD-10-CM

## 2021-03-22 DIAGNOSIS — I214 Non-ST elevation (NSTEMI) myocardial infarction: Secondary | ICD-10-CM | POA: Diagnosis not present

## 2021-03-22 DIAGNOSIS — I2510 Atherosclerotic heart disease of native coronary artery without angina pectoris: Secondary | ICD-10-CM

## 2021-03-22 NOTE — Patient Instructions (Signed)
Medication Instructions:  The current medical regimen is effective;  continue present plan and medications.  *If you need a refill on your cardiac medications before your next appointment, please call your pharmacy*  Follow-Up: At [facility], you and your health needs are our priority.  As part of our continuing mission to provide you with exceptional heart care, we have created designated Provider Care Teams.  These Care Teams include your primary Cardiologist (physician) and Advanced Practice Providers (APPs -  Physician Assistants and Nurse Practitioners) who all work together to provide you with the care you need, when you need it.  We recommend signing up for the patient portal called "MyChart".  Sign up information is provided on this After Visit Summary.  MyChart is used to connect with patients for Virtual Visits (Telemedicine).  Patients are able to view lab/test results, encounter notes, upcoming appointments, etc.  Non-urgent messages can be sent to your provider as well.   To learn more about what you can do with MyChart, go to [email or web address].    Your next appointment:   6 month(s)  The format for your next appointment:   In Person  Provider:   Dr Mark Skains  Thank you for choosing [facility]!!      

## 2021-03-22 NOTE — Assessment & Plan Note (Signed)
Atorvastatin 80 mg once a day.  LDL 52 at last check.  Excellent at goal less than 55. ?

## 2021-03-22 NOTE — Progress Notes (Signed)
Cardiology Office Note:    Date:  03/22/2021   ID:  Sean Kirk, DOB 1970/03/10, MRN [id number]  PCP:  No primary care provider on file.   [facility] Providers Cardiologist:  Candee Furbish, MD     Referring MD: Lajean Manes, MD    History of Present Illness:    Sean Kirk is a 51 y.o. male with coronary disease prior non-STEMI in September 2022.  Cardiac catheterization showed one-vessel disease distal left circumflex supplying a small obtuse marginal 3 territory.  There was an attempted angioplasty, unable to cross wire.  Risk of PCI outweighed benefits.  Clopidogrel used for 6 to 12 months post. Echocardiogram showed EF of 65% normal.  Former smoker 1 and half packs per day.  Anxiety is being monitored by Dr. Felipa Eth.  LDL was 120 in September.  Is tolerating the atorvastatin.  Trying plant-based diet.  Lost weight.  His questions were do I stay on blood thinner indefinitely?  If I come off blood thinner had a live monitor blockage or plaque  1 month after being put on the medication my bad cholesterol were 40-ish.  Some minor tingling in his feet.  He sometimes has some pain in the back mid region.  Likely musculoskeletal.  He is done a great job of weight loss he is down 30 pounds.  He would like to go down to 190.  No past medical history on file.  Past Surgical History:  Procedure Laterality Date   CORONARY BALLOON ANGIOPLASTY N/A 10/06/2020   Procedure: CORONARY BALLOON ANGIOPLASTY;  Surgeon: Wellington Hampshire, MD;  Location: [facility];  Service: Cardiovascular;  Laterality: N/A;   LEFT HEART CATH AND CORONARY ANGIOGRAPHY N/A 10/06/2020   Procedure: LEFT HEART CATH AND CORONARY ANGIOGRAPHY;  Surgeon: Wellington Hampshire, MD;  Location: [place] CV LAB;  Service: Cardiovascular;  Laterality: N/A;    Current Medications: Current Meds  Medication Sig   aspirin 81 MG chewable tablet Chew 1 tablet (81 mg total) by mouth daily.   atorvastatin  (LIPITOR) 80 MG tablet Take 1 tablet (80 mg total) by mouth daily.   cetirizine (ZYRTEC) 10 MG tablet Take 10 mg by mouth daily as needed for allergies.   clopidogrel (PLAVIX) 75 MG tablet Take 1 tablet (75 mg total) by mouth daily with breakfast.   fluticasone (FLONASE) 50 MCG/ACT nasal spray Place 1 spray into both nostrils daily as needed for allergies or rhinitis.   lisinopril (ZESTRIL) 5 MG tablet TAKE 1 TABLET BY MOUTH DAILY   nitroGLYCERIN (NITROSTAT) 0.4 MG SL tablet Place 1 tablet (0.4 mg total) under the tongue every 5 (five) minutes x 3 doses as needed for chest pain.     Allergies:   Patient has no known allergies.   Social History   Socioeconomic History   Marital status: Single    Spouse name: Not on file   Number of children: Not on file   Years of education: Not on file   Highest education level: Not on file  Occupational History   Not on file  Tobacco Use   Smoking status: Every Day    Packs/day: 1.50    Types: Cigarettes   Smokeless tobacco: Never  Substance and Sexual Activity   Alcohol use: Yes   Drug use: Yes    Types: Marijuana   Sexual activity: Not on file  Other Topics Concern   Not on file  Social History Narrative   Not on file   Social Determinants  of Health   Financial Resource Strain: Not on file  Food Insecurity: Not on file  Transportation Needs: Not on file  Physical Activity: Not on file  Stress: Not on file  Social Connections: Not on file     Family History: The patient's family history includes CAD in his maternal grandmother and maternal uncle.  ROS:   Please see the history of present illness.    No fevers chills nausea vomiting syncope bleeding all other systems reviewed and are negative.  EKGs/Labs/Other Studies Reviewed:    The following studies were reviewed today: Cardiac catheterization 10/06/2020: Diagnostic Dominance: Right Intervention    EKG: Sinus rhythm 62 no other abnormalities on 10/11/2020 personally  reviewed and interpreted  Recent Labs: 10/06/2020: B Natriuretic Peptide 64.8; TSH 2.504 10/07/2020: Hemoglobin 16.7; Platelets 177 12/03/2020: ALT 29; BUN 9; Creatinine, Ser 0.94; Potassium 4.4; Sodium 139  Recent Lipid Panel    Component Value Date/Time   CHOL 112 12/03/2020 0805   TRIG 119 12/03/2020 0805   HDL 38 (L) 12/03/2020 0805   CHOLHDL 2.9 12/03/2020 0805   CHOLHDL 5.2 10/06/2020 0653   VLDL 24 10/06/2020 0653   LDLCALC 52 12/03/2020 0805     Risk Assessment/Calculations:              Physical Exam:    VS:  BP 120/70 (BP Location: Left Arm, Patient Position: Sitting, Cuff Size: Normal)    Pulse 73    Ht 5' 11.5" (1.816 m)    Wt 215 lb 3.2 oz (97.6 kg)    SpO2 98%    BMI 29.60 kg/m     Wt Readings from Last 3 Encounters:  03/22/21 215 lb 3.2 oz (97.6 kg)  10/27/20 228 lb 6.4 oz (103.6 kg)  10/05/20 240 lb (108.9 kg)     GEN:  Well nourished, well developed in no acute distress HEENT: Normal NECK: No JVD; No carotid bruits LYMPHATICS: No lymphadenopathy CARDIAC: RRR, no murmurs, no rubs, gallops RESPIRATORY:  Clear to auscultation without rales, wheezing or rhonchi  ABDOMEN: Soft, non-tender, non-distended MUSCULOSKELETAL:  No edema; No deformity  SKIN: Warm and dry NEUROLOGIC:  Alert and oriented x 3 PSYCHIATRIC:  Normal affect   ASSESSMENT:    1. Coronary artery disease involving native coronary artery of native heart with other form of angina pectoris ([place])   2. Research exam   3. NSTEMI (non-ST elevated myocardial infarction) ([place])   4. Mixed hyperlipidemia   5. Primary hypertension   6. Former smoker   7. Coronary artery disease involving native coronary artery of native heart without angina pectoris    PLAN:    In order of problems listed above:  NSTEMI (non-ST elevated myocardial infarction) (HCC) Small distal circumflex/obtuse marginal.  Plavix aspirin.  No stent placed. We will continue the Plavix and aspirin combination for 1 year  total.  After that, aspirin 81 mg lifelong seems reasonable.  Continue with high intensity statin.  Overall doing well.  Hyperlipidemia Atorvastatin 80 mg once a day.  LDL 52 at last check.  Excellent at goal less than 55.  Hypertension Blood pressure successfully treated with diet exercise and low-dose lisinopril.  No changes made.  Former smoker He quit the day of his heart attack.  That he has not smoked since.  Coronary artery disease involving native coronary artery of native heart without angina pectoris Distal circumflex lesion.  No other coronary disease.  No stent placement.         Medication Adjustments/Labs and  Tests Ordered: Current medicines are reviewed at length with the patient today.  Concerns regarding medicines are outlined above.  Orders Placed This Encounter  Procedures   EKG 12-Lead   No orders of the defined types were placed in this encounter.   Patient Instructions  Medication Instructions:  The current medical regimen is effective;  continue present plan and medications.  *If you need a refill on your cardiac medications before your next appointment, please call your pharmacy*   Follow-Up: At [facility], you and your health needs are our priority.  As part of our continuing mission to provide you with exceptional heart care, we have created designated Provider Care Teams.  These Care Teams include your primary Cardiologist (physician) and Advanced Practice Providers (APPs -  Physician Assistants and Nurse Practitioners) who all work together to provide you with the care you need, when you need it.  We recommend signing up for the patient portal called "MyChart".  Sign up information is provided on this After Visit Summary.  MyChart is used to connect with patients for Virtual Visits (Telemedicine).  Patients are able to view lab/test results, encounter notes, upcoming appointments, etc.  Non-urgent messages can be sent to your provider as well.    To learn more about what you can do with MyChart, go to [email or web address].    Your next appointment:   6 month(s)  The format for your next appointment:   In Person  Provider:   Dr Candee Furbish  Thank you for choosing [facility]!!      Signed, Candee Furbish, MD  03/22/2021 3:25 PM    [place]

## 2021-03-22 NOTE — Assessment & Plan Note (Signed)
Blood pressure successfully treated with diet exercise and low-dose lisinopril.  No changes made. ?

## 2021-03-22 NOTE — Assessment & Plan Note (Signed)
Distal circumflex lesion.  No other coronary disease.  No stent placement. ?

## 2021-03-22 NOTE — Assessment & Plan Note (Signed)
He quit the day of his heart attack.  That he has not smoked since. ?

## 2021-03-22 NOTE — Assessment & Plan Note (Signed)
Small distal circumflex/obtuse marginal.  Plavix aspirin.  No stent placed. ?We will continue the Plavix and aspirin combination for 1 year total.  After that, aspirin 81 mg lifelong seems reasonable.  Continue with high intensity statin.  Overall doing well. ?

## 2021-03-30 IMAGING — US US LIVER ELASTOGRAPHY
1 series · 12 of 25 positions shown · non-contrast
Comparison: Ultrasound 12/02/2013

CLINICAL DATA: Fatty infiltration of the liver.

EXAM:
US LIVER ELASTOGRAPHY
TECHNIQUE: Sonography of the liver was performed. In addition, ultrasound
elastography evaluation of the liver was performed. A region of
interest was placed within the right lobe of the liver. Following
application of a compressive sonographic pulse, tissue
compressibility was assessed. Multiple assessments were performed at
the selected site. Median tissue compressibility was determined.
Previously, hepatic stiffness was assessed by shear wave velocity.
Based on recently published Society of Radiologists in Ultrasound
consensus article, reporting is now recommended to be performed in
the SI units of pressure (kiloPascals) representing hepatic
stiffness/elasticity. The obtained result is compared to the
published reference standards. (cACLD = compensated Advanced Chronic
Liver Disease)

[Series 1: us elastography liver · 12 of 33 slices shown]
[im 2/33]
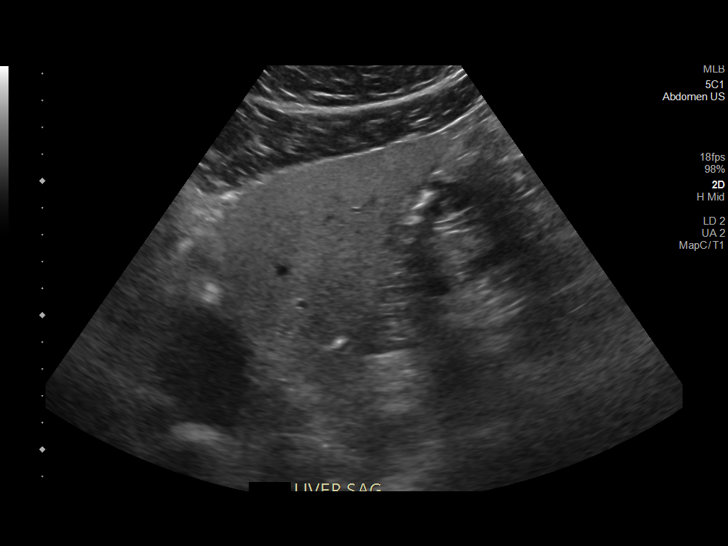
[im 5/33]
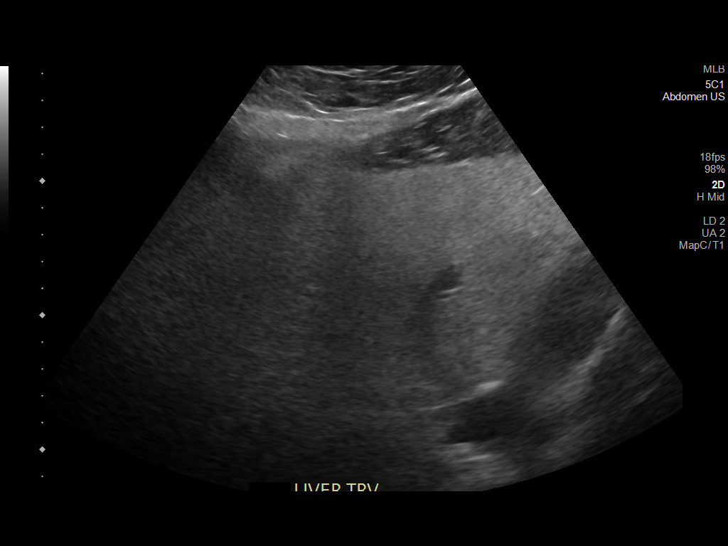
[im 7/33]
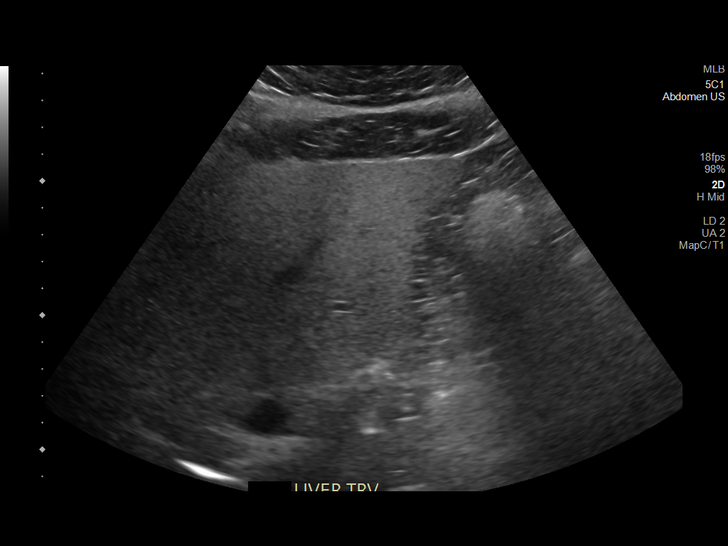
[im 10/33]
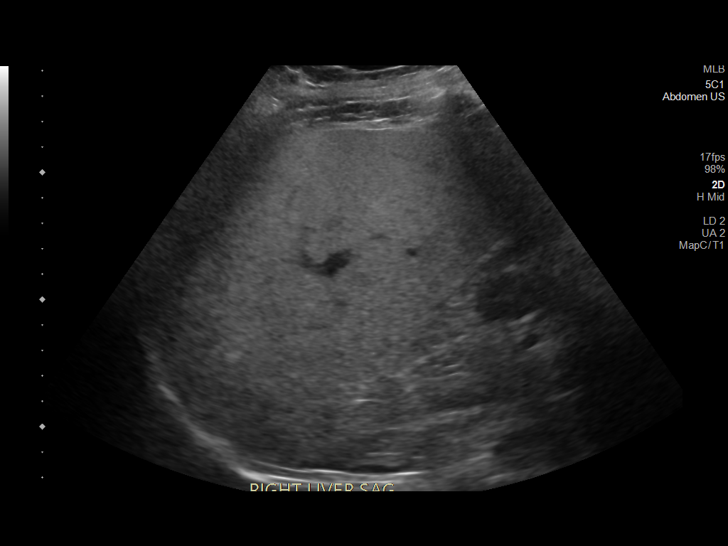
[im 13/33]
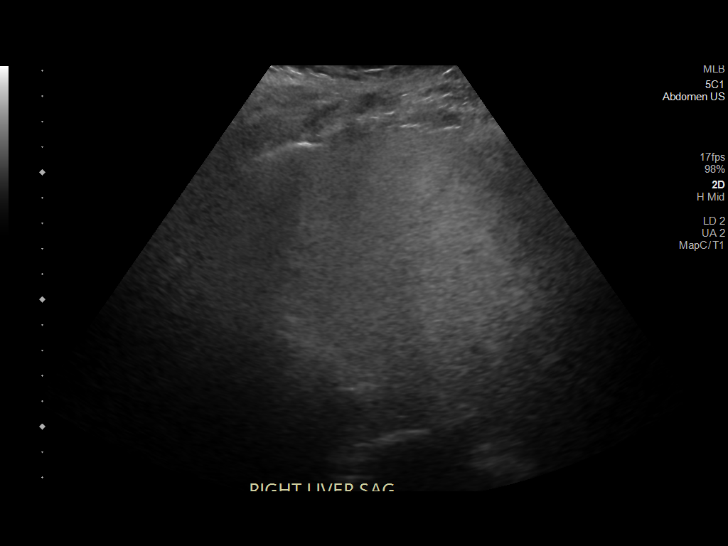
[im 15/33]
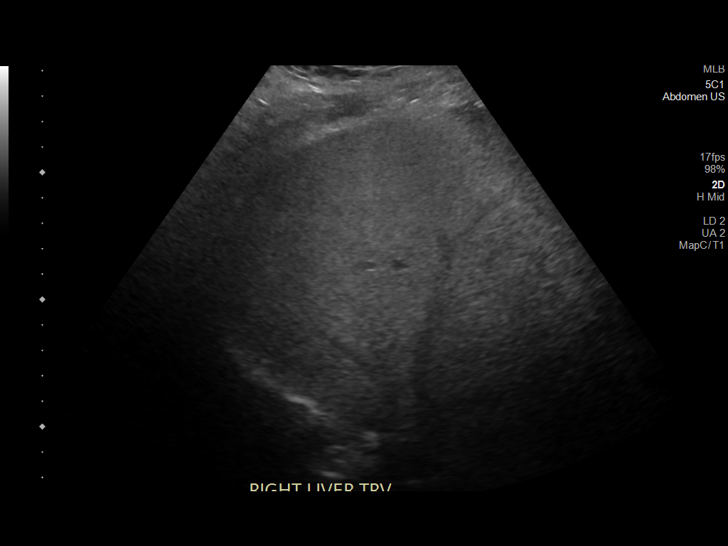
[im 18/33]
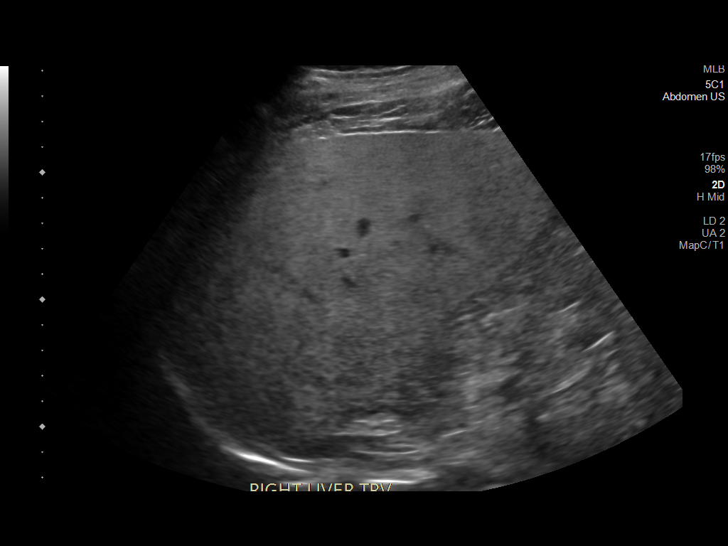
[im 21/33]
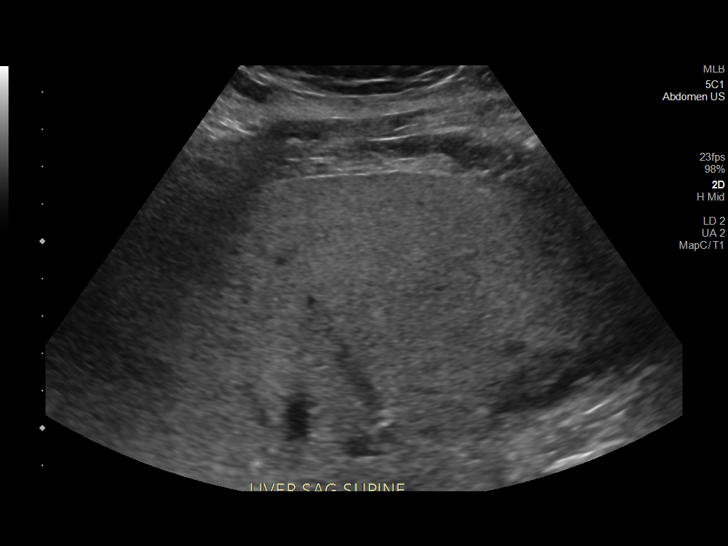
[im 23/33]
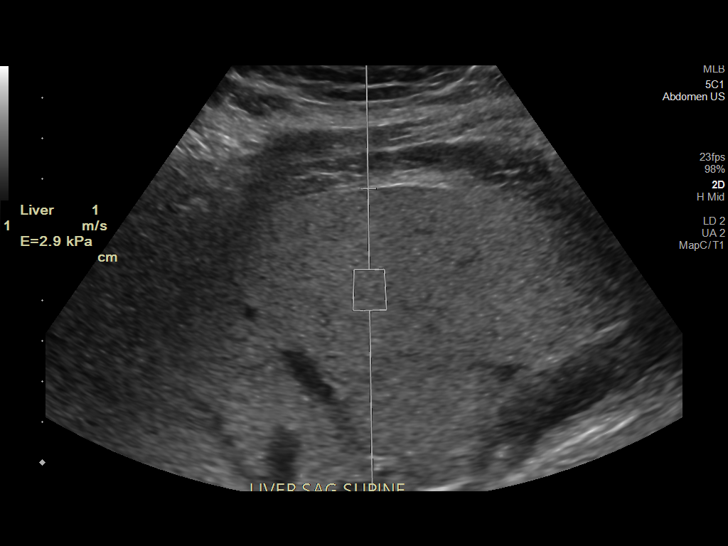
[im 26/33]
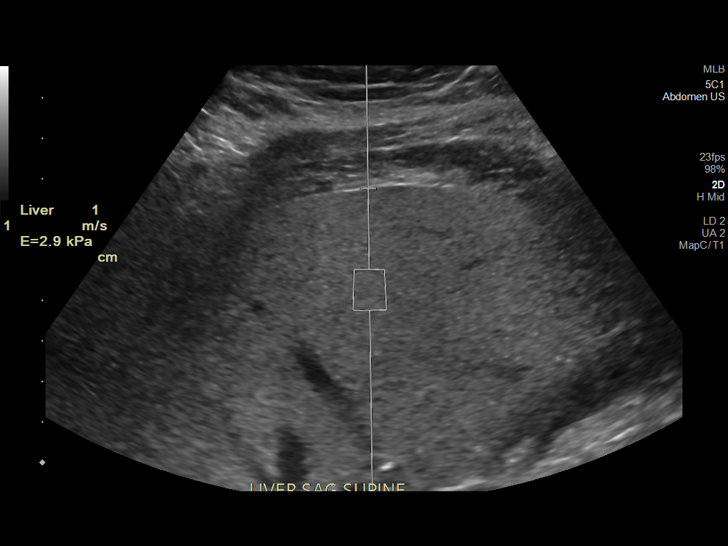
[im 29/33]
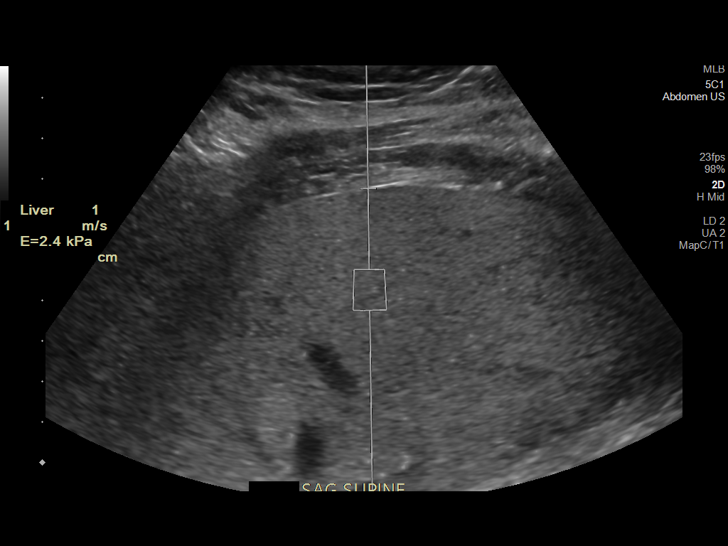
[im 31/33]
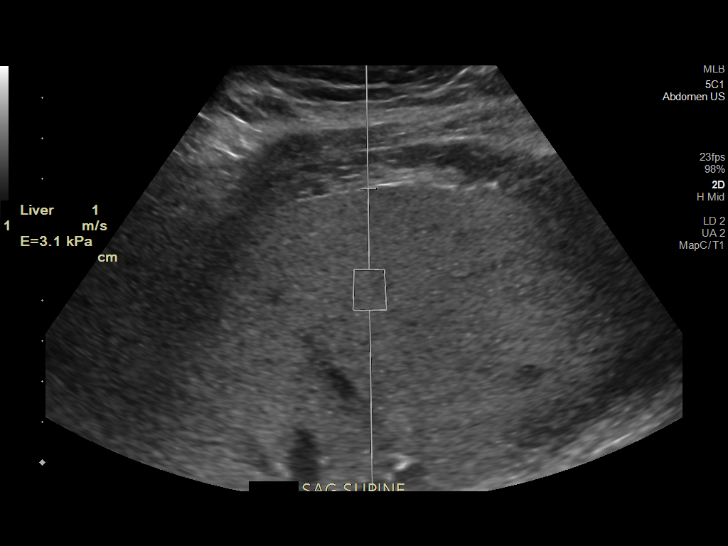

[12 of 25 positions shown; findings below may reference images not displayed]

FINDINGS: Liver: Increased echotexture compatible with fatty infiltration. No
focal abnormality or biliary ductal dilatation. Portal vein is
patent on color Doppler imaging with normal direction of blood flow
towards the liver.

ULTRASOUND HEPATIC ELASTOGRAPHY

Device: Siemens Helix VTQ

Patient position: Supine

Transducer: [id number]

Number of measurements: 10

Hepatic segment:  8

Median kPa:

IQR:

IQR/Median kPa ratio:

Data quality:  Good

Diagnostic category:  < or = 5 kPa: high probability of being normal

The use of hepatic elastography is applicable to patients with viral
hepatitis and non-alcoholic fatty liver disease. At this time, there
is insufficient data for the referenced cut-off values and use in
other causes of liver disease, including alcoholic liver disease.
Patients, however, may be assessed by elastography and serve as
their own reference standard/baseline.

In patients with non-alcoholic liver disease, the values suggesting
compensated advanced chronic liver disease (cACLD) may be lower, and
patients may need additional testing with elasticity results of [DATE]
kPa.

Please note that abnormal hepatic elasticity and shear wave
velocities may also be identified in clinical settings other than
with hepatic fibrosis, such as: acute hepatitis, elevated right
heart and central venous pressures including use of beta blockers,
Evanyse disease (Tiger), infiltrative processes such as
mastocytosis/amyloidosis/infiltrative tumor/lymphoma, extrahepatic
cholestasis, with hyperemia in the post-prandial state, and with
liver transplantation. Correlation with patient history, laboratory
data, and clinical condition recommended.

Diagnostic Categories:

< or =5 kPa: high probability of being normal

< or =9 kPa: in the absence of other known clinical signs, rules [DATE] kPa and ?13 kPa: suggestive of cACLD, but needs further testing

>13 kPa: highly suggestive of cACLD

> or =17 kPa: highly suggestive of cACLD with an increased
probability of clinically significant portal hypertension
IMPRESSION: ULTRASOUND LIVER:

Diffuse fatty infiltration of the liver.

ULTRASOUND HEPATIC ELASTOGRAPHY:

Median kPa:

Diagnostic category:  < or = 5 kPa: high probability of being normal

## 2021-04-05 ENCOUNTER — Other Ambulatory Visit: Payer: Self-pay

## 2021-04-05 ENCOUNTER — Encounter: Payer: Self-pay | Admitting: Cardiology

## 2021-04-05 MED ORDER — ATORVASTATIN CALCIUM 80 MG PO TABS: 80.0000 mg | ORAL_TABLET | Freq: Every day | ORAL | 1 refills | Status: DC

## 2021-06-28 ENCOUNTER — Other Ambulatory Visit: Payer: Self-pay | Admitting: Cardiology

## 2021-09-26 ENCOUNTER — Encounter: Payer: Self-pay | Admitting: Cardiology

## 2021-09-26 ENCOUNTER — Ambulatory Visit: Payer: BC Managed Care – PPO | Attending: Cardiology | Admitting: Cardiology

## 2021-09-26 VITALS — BP 112/68 | HR 67 | Ht 71.5 in | Wt 205.0 lb

## 2021-09-26 DIAGNOSIS — I10 Essential (primary) hypertension: Secondary | ICD-10-CM

## 2021-09-26 DIAGNOSIS — E782 Mixed hyperlipidemia: Secondary | ICD-10-CM

## 2021-09-26 DIAGNOSIS — I25118 Atherosclerotic heart disease of native coronary artery with other forms of angina pectoris: Secondary | ICD-10-CM | POA: Diagnosis not present

## 2021-09-26 NOTE — Patient Instructions (Signed)
Medication Instructions:  You may discontinue your Plavix (Clopidogrel) when finished with your current refill. Continue all other medications as listed.   *If you need a refill on your cardiac medications before your next appointment, please call your pharmacy*  Follow-Up: At [facility], you and your health needs are our priority.  As part of our continuing mission to provide you with exceptional heart care, we have created designated Provider Care Teams.  These Care Teams include your primary Cardiologist (physician) and Advanced Practice Providers (APPs -  Physician Assistants and Nurse Practitioners) who all work together to provide you with the care you need, when you need it.  We recommend signing up for the patient portal called "MyChart".  Sign up information is provided on this After Visit Summary.  MyChart is used to connect with patients for Virtual Visits (Telemedicine).  Patients are able to view lab/test results, encounter notes, upcoming appointments, etc.  Non-urgent messages can be sent to your provider as well.   To learn more about what you can do with MyChart, go to [email or web address].    Your next appointment:   1 year(s)  The format for your next appointment:   In Person  Provider:   Donato Schultz, MD      Important Information About Sugar

## 2021-09-26 NOTE — Progress Notes (Signed)
Cardiology Office Note:    Date:  09/26/2021   ID:  Sean Kirk, DOB 1970-08-06, MRN [id number]  PCP:  No primary care provider on file.   [facility] Providers Cardiologist:  Candee Furbish, MD     Referring MD: No ref. provider found    History of Present Illness:    Sean Kirk is a 51 y.o. male with coronary disease prior non-STEMI in September 2022.  Cardiac catheterization showed one-vessel disease distal left circumflex supplying a small obtuse marginal 3 territory.  There was an attempted angioplasty, unable to cross wire.  Risk of PCI outweighed benefits.  Clopidogrel used for 6 to 12 months post. Echocardiogram showed EF of 65% normal.  Former smoker 1 and half packs per day.  Anxiety is being monitored by Dr. Felipa Eth.  Alfonse Flavors  LDL was 120 in September.  Is tolerating the atorvastatin.  Trying plant-based diet.  Lost weight.  His questions were do I stay on blood thinner indefinitely?  Explained that he may stop his Plavix 1 year post MI.  Continue with aspirin low-dose otherwise.  We explained that we do not do routine testing such as stress tests or CT scans unless symptoms arise.  Some minor tingling in his feet.  He sometimes has some pain in the back mid region.  Likely musculoskeletal.  He is done a great job of weight loss he is down 30 pounds.  He would like to go down to 190.  Overall doing quite well walking 16,000 steps a day.  Looking to branch out into swimming laps but having some trouble finding an adult swim instructor.  Does enjoy beer 3-4 times per week.  Trying to cut back on calories.  No past medical history on file.  Past Surgical History:  Procedure Laterality Date   CORONARY BALLOON ANGIOPLASTY N/A 10/06/2020   Procedure: CORONARY BALLOON ANGIOPLASTY;  Surgeon: Wellington Hampshire, MD;  Location: [facility];  Service: Cardiovascular;  Laterality: N/A;   LEFT HEART CATH AND CORONARY ANGIOGRAPHY N/A 10/06/2020   Procedure:  LEFT HEART CATH AND CORONARY ANGIOGRAPHY;  Surgeon: Wellington Hampshire, MD;  Location: [facility];  Service: Cardiovascular;  Laterality: N/A;    Current Medications: Current Meds  Medication Sig   aspirin 81 MG chewable tablet CHEW 1 TABLET BY MOUTH DAILY   atorvastatin (LIPITOR) 80 MG tablet Take 1 tablet (80 mg total) by mouth daily.   busPIRone (BUSPAR) 5 MG tablet Take 5 mg by mouth 3 (three) times daily.   cetirizine (ZYRTEC) 10 MG tablet Take 10 mg by mouth daily as needed for allergies.   clopidogrel (PLAVIX) 75 MG tablet TAKE 1 TABLET BY MOUTH EVERY DAY WITH BREAKFAST   fluticasone (FLONASE) 50 MCG/ACT nasal spray Place 1 spray into both nostrils daily as needed for allergies or rhinitis.   lisinopril (ZESTRIL) 5 MG tablet TAKE 1 TABLET BY MOUTH DAILY   nitroGLYCERIN (NITROSTAT) 0.4 MG SL tablet Place 1 tablet (0.4 mg total) under the tongue every 5 (five) minutes x 3 doses as needed for chest pain.     Allergies:   Patient has no known allergies.   Social History   Socioeconomic History   Marital status: Single    Spouse name: Not on file   Number of children: Not on file   Years of education: Not on file   Highest education level: Not on file  Occupational History   Not on file  Tobacco Use   Smoking  status: Every Day    Packs/day: 1.50    Types: Cigarettes   Smokeless tobacco: Never  Substance and Sexual Activity   Alcohol use: Yes   Drug use: Yes    Types: Marijuana   Sexual activity: Not on file  Other Topics Concern   Not on file  Social History Narrative   Not on file   Social Determinants of Health   Financial Resource Strain: Not on file  Food Insecurity: Not on file  Transportation Needs: Not on file  Physical Activity: Not on file  Stress: Not on file  Social Connections: Not on file     Family History: The patient's family history includes CAD in his maternal grandmother and maternal uncle.  ROS:   Please see the history of present  illness.    No fevers chills nausea vomiting syncope bleeding all other systems reviewed and are negative.  EKGs/Labs/Other Studies Reviewed:    The following studies were reviewed today: Cardiac catheterization 10/06/2020: Diagnostic Dominance: Right Intervention    EKG: Sinus rhythm 62 no other abnormalities on 10/11/2020 personally reviewed and interpreted  Recent Labs: 10/06/2020: B Natriuretic Peptide 64.8; TSH 2.504 10/07/2020: Hemoglobin 16.7; Platelets 177 12/03/2020: ALT 29; BUN 9; Creatinine, Ser 0.94; Potassium 4.4; Sodium 139  Recent Lipid Panel    Component Value Date/Time   CHOL 112 12/03/2020 0805   TRIG 119 12/03/2020 0805   HDL 38 (L) 12/03/2020 0805   CHOLHDL 2.9 12/03/2020 0805   CHOLHDL 5.2 10/06/2020 0653   VLDL 24 10/06/2020 0653   LDLCALC 52 12/03/2020 0805     Risk Assessment/Calculations:              Physical Exam:    VS:  BP 112/68   Pulse 67   Ht 5' 11.5" (1.816 m)   Wt 205 lb (93 kg)   SpO2 97%   BMI 28.19 kg/m     Wt Readings from Last 3 Encounters:  09/26/21 205 lb (93 kg)  03/22/21 215 lb 3.2 oz (97.6 kg)  10/27/20 228 lb 6.4 oz (103.6 kg)     GEN:  Well nourished, well developed in no acute distress HEENT: Normal NECK: No JVD; No carotid bruits LYMPHATICS: No lymphadenopathy CARDIAC: RRR, no murmurs, no rubs, gallops RESPIRATORY:  Clear to auscultation without rales, wheezing or rhonchi  ABDOMEN: Soft, non-tender, non-distended MUSCULOSKELETAL:  No edema; No deformity  SKIN: Warm and dry NEUROLOGIC:  Alert and oriented x 3 PSYCHIATRIC:  Normal affect   Stable, no change in physical exam  ASSESSMENT:    1. Coronary artery disease involving native coronary artery of native heart with other form of angina pectoris (HCC)   2. Mixed hyperlipidemia   3. Primary hypertension     PLAN:    In order of problems listed above:   NSTEMI (non-ST elevated myocardial infarction) (HCC) 09/2020 Small distal circumflex/obtuse  marginal 3.  Plavix aspirin.  No stent placed. We will continue the Plavix and aspirin combination for 1 year total.  After that, aspirin 81 mg lifelong.  Continue with high intensity statin.  LDL is currently 52.  Atorvastatin 80 mg.  Overall doing well.   Hyperlipidemia Atorvastatin 80 mg once a day.  LDL 52 at last check.  Excellent at goal less than 55.  Triglycerides 253.  Has been high the majority of his life.  Could consider addition of Vascepa in the future.   Hypertension Blood pressure successfully treated with diet exercise and low-dose lisinopril.  No changes  made.  Not dizzy.  Blood pressures get too low, could always discontinue lisinopril.   Former smoker He quit the day of his heart attack.  No smoking since..   Coronary artery disease involving native coronary artery of native heart without angina pectoris Distal circumflex lesion.  No other coronary disease.  No stent placement.  See cath report.  Reviewed again.         Medication Adjustments/Labs and Tests Ordered: Current medicines are reviewed at length with the patient today.  Concerns regarding medicines are outlined above.  No orders of the defined types were placed in this encounter.  No orders of the defined types were placed in this encounter.   Patient Instructions  Medication Instructions:  You may discontinue your Plavix (Clopidogrel) when finished with your current refill. Continue all other medications as listed.   *If you need a refill on your cardiac medications before your next appointment, please call your pharmacy*  Follow-Up: At [facility], you and your health needs are our priority.  As part of our continuing mission to provide you with exceptional heart care, we have created designated Provider Care Teams.  These Care Teams include your primary Cardiologist (physician) and Advanced Practice Providers (APPs -  Physician Assistants and Nurse Practitioners) who all work together to  provide you with the care you need, when you need it.  We recommend signing up for the patient portal called "MyChart".  Sign up information is provided on this After Visit Summary.  MyChart is used to connect with patients for Virtual Visits (Telemedicine).  Patients are able to view lab/test results, encounter notes, upcoming appointments, etc.  Non-urgent messages can be sent to your provider as well.   To learn more about what you can do with MyChart, go to [email or web address].    Your next appointment:   1 year(s)  The format for your next appointment:   In Person  Provider:   Donato Schultz, MD      Important Information About Sugar         Signed, Donato Schultz, MD  09/26/2021 1:42 PM    [facility]

## 2021-10-11 ENCOUNTER — Other Ambulatory Visit: Payer: Self-pay | Admitting: Cardiology

## 2021-10-25 ENCOUNTER — Other Ambulatory Visit: Payer: Self-pay | Admitting: Interventional Cardiology

## 2021-10-25 ENCOUNTER — Encounter: Payer: Self-pay | Admitting: Cardiology

## 2021-10-25 MED ORDER — LISINOPRIL 5 MG PO TABS: 5.0000 mg | ORAL_TABLET | Freq: Every day | ORAL | 3 refills | Status: DC

## 2021-11-28 ENCOUNTER — Other Ambulatory Visit: Payer: Self-pay | Admitting: Cardiology

## 2022-01-04 ENCOUNTER — Other Ambulatory Visit: Payer: Self-pay | Admitting: Cardiology

## 2022-04-24 ENCOUNTER — Other Ambulatory Visit: Payer: Self-pay | Admitting: Cardiology

## 2022-07-31 ENCOUNTER — Other Ambulatory Visit: Payer: Self-pay | Admitting: Cardiology

## 2022-08-15 ENCOUNTER — Other Ambulatory Visit: Payer: Self-pay | Admitting: Internal Medicine

## 2022-08-15 DIAGNOSIS — R911 Solitary pulmonary nodule: Secondary | ICD-10-CM

## 2022-09-04 ENCOUNTER — Ambulatory Visit: Admission: RE | Admit: 2022-09-04 | Payer: BC Managed Care – PPO | Source: Ambulatory Visit

## 2022-09-04 DIAGNOSIS — R911 Solitary pulmonary nodule: Secondary | ICD-10-CM

## 2022-10-30 ENCOUNTER — Ambulatory Visit: Payer: BC Managed Care – PPO | Attending: Cardiology | Admitting: Cardiology

## 2022-10-30 ENCOUNTER — Encounter: Payer: Self-pay | Admitting: Cardiology

## 2022-10-30 VITALS — BP 104/62 | HR 67 | Ht 71.0 in | Wt 206.0 lb

## 2022-10-30 DIAGNOSIS — E782 Mixed hyperlipidemia: Secondary | ICD-10-CM | POA: Diagnosis not present

## 2022-10-30 DIAGNOSIS — I25118 Atherosclerotic heart disease of native coronary artery with other forms of angina pectoris: Secondary | ICD-10-CM

## 2022-10-30 DIAGNOSIS — I10 Essential (primary) hypertension: Secondary | ICD-10-CM | POA: Diagnosis not present

## 2022-10-30 NOTE — Progress Notes (Signed)
Cardiology Office Note:  .   Date:  10/30/2022  ID:  Sean Kirk, DOB 28-Apr-1970, MRN [id number] PCP: No primary care provider on file.  [facility] Providers Cardiologist:  Donato Schultz, MD     History of Present Illness: .   Sean Kirk is a 52 y.o. male Discussed with the use of AI scribe   History of Present Illness   The patient, a 52 year old with a history of coronary artery disease and a non-ST elevation myocardial infarction in September 2022, presents for a follow-up visit. At the time of the myocardial infarction, the patient underwent cardiac catheterization which revealed one-vessel disease in the distal left circumflex artery supplying a small obtuse marginal three territory. Intervention was deemed too high risk due to the difficulty in crossing the lesion with a wire.  The patient, a former smoker, has been managing his condition with medical therapy, including aspirin 81mg , atorvastatin 80mg , and lisinopril 5mg  daily. He reports no new symptoms and has been actively trying to lose weight through exercise and diet modifications. Despite his efforts, he expresses frustration with persistent weight issues.  Occasionally, the patient experiences episodes of lightheadedness, which he attributes to occasional low blood pressure readings. He monitors his blood pressure regularly and notes that it tends to be lower an hour or two after taking his medication.  The patient has made significant lifestyle changes, including quitting smoking and losing a substantial amount of weight, from 250lbs to around 200lbs. Despite these positive changes, he still finds smoking an attractive proposition, indicating the ongoing struggle with this former habit.          ROS: No CP no SOB.   Studies Reviewed: Marland Kitchen   EKG Interpretation Date/Time:  Monday October 30 2022 09:59:36 EDT Ventricular Rate:  64 PR Interval:  152 QRS Duration:  78 QT Interval:  376 QTC Calculation: 387 R  Axis:   37  Text Interpretation: Normal sinus rhythm Normal ECG When compared with ECG of 07-Oct-2020 10:19, No significant change was found Confirmed by Donato Schultz ([id number]) on 10/30/2022 10:13:08 AM    Results LABS LDL cholesterol: 45 (07/2022) Creatinine: 0.88  RADIOLOGY CT scan: Coronary artery calcifications, non-obstructive calcified plaque in the right coronary artery as well as LAD and Circ.  Personally reviewed and interpreted.  Showed him the images.  DIAGNOSTIC Cardiac catheterization: One vessel disease in the distal left circumflex supplying a small obtuse marginal three territory. Unable to cross with wire. (09/2020)  Risk Assessment/Calculations:            Physical Exam:   VS:  BP 104/62   Pulse 67   Ht 5\' 11"  (1.803 m)   Wt 206 lb (93.4 kg)   SpO2 97%   BMI 28.73 kg/m    Wt Readings from Last 3 Encounters:  10/30/22 206 lb (93.4 kg)  09/26/21 205 lb (93 kg)  03/22/21 215 lb 3.2 oz (97.6 kg)    GEN: Well nourished, well developed in no acute distress NECK: No JVD; No carotid bruits CARDIAC: RRR, no murmurs, no rubs, no gallops RESPIRATORY:  Clear to auscultation without rales, wheezing or rhonchi  ABDOMEN: Soft, non-tender, non-distended EXTREMITIES:  No edema; No deformity   ASSESSMENT AND PLAN: .    Assessment and Plan    Coronary Artery Disease (CAD) History of non-ST elevation myocardial infarction in September 2022 with one vessel disease in the distal left circumflex. PCI was not performed due to high risk and small territory supplied. Currently asymptomatic  and on medical management. LDL cholesterol was 45 in July 2024. -Continue Aspirin 81mg  daily and Atorvastatin 80mg  daily. -Continue lifestyle modifications including diet and exercise.  Hypertension Well controlled on Lisinopril 5mg  daily. Occasional lightheadedness reported, possibly related to low blood pressure. -Continue Lisinopril 5mg  daily. -Monitor for symptoms of hypotension and  consider dose adjustment if symptoms persist.  Weight Management Significant weight loss achieved through diet and exercise, but patient reports difficulty in further weight loss. -Encourage continued efforts in diet and exercise. 250 down to 200  Follow-up in 1 year unless changes occur.             Signed, Donato Schultz, MD

## 2022-10-30 NOTE — Patient Instructions (Signed)
Medication Instructions:  Your physician recommends that you continue on your current medications as directed. Please refer to the Current Medication list given to you today.  *If you need a refill on your cardiac medications before your next appointment, please call your pharmacy*   Lab Work: NONE If you have labs (blood work) drawn today and your tests are completely normal, you will receive your results only by: MyChart Message (if you have MyChart) OR A paper copy in the mail If you have any lab test that is abnormal or we need to change your treatment, we will call you to review the results.   Testing/Procedures: NONE   Follow-Up: At [facility], you and your health needs are our priority.  As part of our continuing mission to provide you with exceptional heart care, we have created designated Provider Care Teams.  These Care Teams include your primary Cardiologist (physician) and Advanced Practice Providers (APPs -  Physician Assistants and Nurse Practitioners) who all work together to provide you with the care you need, when you need it.  Your next appointment:   1 year(s)  Provider:   Donato Schultz, MD

## 2023-01-23 ENCOUNTER — Other Ambulatory Visit: Payer: Self-pay | Admitting: Cardiology

## 2023-03-03 ENCOUNTER — Other Ambulatory Visit: Payer: Self-pay | Admitting: Cardiology

## 2023-03-09 ENCOUNTER — Encounter: Payer: Self-pay | Admitting: Cardiology

## 2023-03-09 ENCOUNTER — Other Ambulatory Visit: Payer: Self-pay | Admitting: Cardiology

## 2023-03-09 MED ORDER — ATORVASTATIN CALCIUM 80 MG PO TABS: 80.0000 mg | ORAL_TABLET | Freq: Every day | ORAL | 2 refills | Status: DC

## 2023-07-31 ENCOUNTER — Other Ambulatory Visit: Payer: Self-pay | Admitting: Cardiology

## 2023-08-16 ENCOUNTER — Other Ambulatory Visit: Payer: Self-pay | Admitting: Internal Medicine

## 2023-08-16 DIAGNOSIS — Z122 Encounter for screening for malignant neoplasm of respiratory organs: Secondary | ICD-10-CM

## 2023-10-10 ENCOUNTER — Other Ambulatory Visit: Payer: Self-pay

## 2023-10-11 ENCOUNTER — Ambulatory Visit
Admission: RE | Admit: 2023-10-11 | Discharge: 2023-10-11 | Disposition: A | Discharge status: Home / Self Care | Payer: Self-pay | Source: Ambulatory Visit | Attending: Internal Medicine | Admitting: Internal Medicine

## 2023-10-11 DIAGNOSIS — Z122 Encounter for screening for malignant neoplasm of respiratory organs: Secondary | ICD-10-CM

## 2023-11-13 ENCOUNTER — Encounter: Payer: Self-pay | Admitting: Cardiology

## 2023-11-13 ENCOUNTER — Ambulatory Visit: Payer: Self-pay | Attending: Cardiology | Admitting: Cardiology

## 2023-11-13 VITALS — BP 117/74 | HR 66 | Ht 71.0 in | Wt 205.0 lb

## 2023-11-13 DIAGNOSIS — I10 Essential (primary) hypertension: Secondary | ICD-10-CM

## 2023-11-13 DIAGNOSIS — E782 Mixed hyperlipidemia: Secondary | ICD-10-CM | POA: Diagnosis not present

## 2023-11-13 DIAGNOSIS — Z87891 Personal history of nicotine dependence: Secondary | ICD-10-CM

## 2023-11-13 DIAGNOSIS — I25118 Atherosclerotic heart disease of native coronary artery with other forms of angina pectoris: Secondary | ICD-10-CM | POA: Diagnosis not present

## 2023-11-13 NOTE — Progress Notes (Signed)
 Cardiology Office Note:  .   Date:  11/13/2023  ID:  Sean Kirk, DOB May 22, 1970, MRN [id number] PCP: Charlott Dorn LABOR, MD  [facility] Providers Cardiologist:  Oneil Parchment, MD     History of Present Illness: .   Sean Kirk is a 53 y.o. male Discussed the use of AI scribe software   History of Present Illness Sean Kirk is a 53 year old male with coronary artery disease status post non-ST elevation myocardial infarction who presents for follow-up.  In September 2022, he experienced a non-ST elevation myocardial infarction. A cardiac catheterization revealed one vessel disease in the distal left circumflex artery, and intervention was deemed too high risk. He was managed medically. An echocardiogram showed an ejection fraction of 65% with normal valvular function.  He has not experienced any new symptoms since his last visit. He occasionally has low blood pressure and lightheadedness one to two hours after taking his medications. He has lost a significant amount of weight over the past year. He monitors his blood pressure and has not observed high readings, with occasional readings over 130. He occasionally experiences a 'little ping' when walking, which he perceives as musculoskeletal.  He is currently taking atorvastatin  80 mg for hyperlipidemia, lisinopril  5 mg daily for hypertension, and aspirin  81 mg.  He is a former smoker and occasionally finds smoking appealing. 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         Studies Reviewed: SABRA   EKG Interpretation Date/Time:  Tuesday November 13 2023 09:08:19 EDT Ventricular Rate:  66 PR Interval:  152 QRS Duration:  76 QT Interval:  364 QTC Calculation: 381 R Axis:   49  Text Interpretation: Normal sinus rhythm When compared with ECG of 30-Oct-2022 09:59, No significant change was found Confirmed by Parchment Oneil ([id number]) on 11/13/2023 9:10:19 AM     Results DIAGNOSTIC Cardiac catheterization: One vessel disease in the distal left circumflex artery supplying a small obtuse marginal three territory. Intervention deemed too high risk due to difficulty in crossing the lesion with a wire. (2022) Echocardiogram: Ejection fraction (EF) 65% with normal valvular function. (2022) EKG: Normal (11/13/2023) Risk Assessment/Calculations:            Physical Exam:   VS:  BP 117/74   Pulse 66   Ht 5' 11 (1.803 m)   Wt 205 lb (93 kg)   SpO2 96%   BMI 28.59 kg/m    Wt Readings from Last 3 Encounters:  11/13/23 205 lb (93 kg)  10/30/22 206 lb (93.4 kg)  09/26/21 205 lb (93 kg)    GEN: Well nourished, well developed in no acute distress NECK: No JVD; No carotid bruits CARDIAC: RRR, no murmurs, no rubs, no gallops RESPIRATORY:  Clear to auscultation without rales, wheezing or rhonchi  ABDOMEN: Soft, non-tender, non-distended EXTREMITIES:  No edema; No deformity   ASSESSMENT AND PLAN: .    Assessment and Plan Assessment & Plan Coronary artery disease, medically managed, status post non-ST elevation myocardial infarction Coronary artery disease is well-managed with no new symptoms. Status post non-ST elevation myocardial infarction in September 2022. Cardiac catheterization in 2022 revealed one vessel disease in the distal left circumflex artery. Intervention was deemed too high risk due to difficulty in crossing the lesion with a wire, and he is being managed medically. Echocardiogram in 2022 showed an ejection fraction of 65% with normal valvular function. He reports occasional  lightheadedness after taking medications, likely due to hypotension. No significant changes in symptoms, and he is maintaining lifestyle modifications and medication adherence. Routine calcium  score screening is not necessary due to prior heart catheterization findings. Continued medical management is essential to keep the plaque stable and prevent further cardiac  events. - Continue aspirin  81 mg daily, atorvastatin  80 mg daily, and lisinopril  5 mg daily. - Monitor for any changes in symptoms, especially during physical exertion. - Reinforce lifestyle modifications including weight management and smoking cessation.  Essential hypertension Hypertension is managed with lisinopril  5 mg daily. Blood pressure readings are stable, occasionally below target, leading to lightheadedness after medication intake. No high readings reported recently. - Continue lisinopril  5 mg daily. - Monitor blood pressure regularly, especially after medication intake, to assess for hypotension.  Hyperlipidemia Hyperlipidemia is managed with atorvastatin  80 mg daily. No new issues reported, and he is adhering to medication regimen. - Continue atorvastatin  80 mg daily.  Weight management Significant weight loss achieved last year, maintaining current weight. He reports feeling less active recently, possibly due to seasonal changes. - Encourage continued weight management and regular physical activity.         Dispo: 1 yr  Signed, Oneil Parchment, MD

## 2023-11-13 NOTE — Patient Instructions (Signed)

## 2023-12-04 ENCOUNTER — Ambulatory Visit (INDEPENDENT_AMBULATORY_CARE_PROVIDER_SITE_OTHER)

## 2023-12-04 ENCOUNTER — Telehealth (HOSPITAL_BASED_OUTPATIENT_CLINIC_OR_DEPARTMENT_OTHER): Payer: Self-pay | Admitting: Student

## 2023-12-04 ENCOUNTER — Ambulatory Visit (HOSPITAL_BASED_OUTPATIENT_CLINIC_OR_DEPARTMENT_OTHER): Admitting: Student

## 2023-12-04 DIAGNOSIS — M25561 Pain in right knee: Secondary | ICD-10-CM

## 2023-12-04 NOTE — Progress Notes (Signed)
 Chief Complaint: Right knee injury    Discussed the use of AI scribe software for clinical note transcription with the patient, who gave verbal consent to proceed.  History of Present Illness Sean Kirk is a 53 year old male who presents with acute right knee pain following a popping sensation and swelling.  He experienced acute right knee pain that began after a workout on Saturday, worsening after bending over to do laundry on Sunday, when he felt a 'pop' and immediate swelling below the knee.  Knee flexion and twisting exacerbates the pain, but he can bear weight as long as the knee remains stable. There is no buckling, giving out, or catching. He has been using rest, ice, compression, and elevation (RICE) since Sunday, with slight improvement in symptoms. He continues to experience pain when bending the knee and is taking Aleve for pain management. 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 He is an avid teacher, english as a foreign language. Pain is localized below the knee, with some involvement of the shin, similar to a shin splint, but not tender to touch. He denies any previous knee surgeries.   Surgical History:   None  PMH/PSH/Family History/Social History/Meds/Allergies:   No past medical history on file. Past Surgical History:  Procedure Laterality Date   CORONARY BALLOON ANGIOPLASTY N/A 10/06/2020   Procedure: CORONARY BALLOON ANGIOPLASTY;  Surgeon: Darron Deatrice LABOR, MD;  Location: [facility];  Service: Cardiovascular;  Laterality: N/A;   LEFT HEART CATH AND CORONARY ANGIOGRAPHY N/A 10/06/2020   Procedure: LEFT HEART CATH AND CORONARY ANGIOGRAPHY;  Surgeon: Darron Deatrice LABOR, MD;  Location: [facility];  Service: Cardiovascular;  Laterality: N/A;   Social History   Socioeconomic History   Marital status: Single    Spouse name: Not on file   Number of children: Not on file    Years of education: Not on file   Highest education level: Not on file  Occupational History   Not on file  Tobacco Use   Smoking status: Every Day    Current packs/day: 1.50    Types: Cigarettes   Smokeless tobacco: Never  Substance and Sexual Activity   Alcohol use: Yes   Drug use: Yes    Types: Marijuana   Sexual activity: Not on file  Other Topics Concern   Not on file  Social History Narrative   Not on file   Social Drivers of Health   Financial Resource Strain: Not on file  Food Insecurity: Not on file  Transportation Needs: Not on file  Physical Activity: Not on file  Stress: Not on file  Social Connections: Not on file   Family History  Problem Relation Age of Onset   CAD Maternal Grandmother    CAD Maternal Uncle    No Known Allergies Current Outpatient Medications  Medication Sig Dispense Refill   aspirin  81 MG chewable tablet CHEW AND SWALLOW 1 TABLET BY MOUTH DAILY 90 tablet 1   atorvastatin  (LIPITOR ) 80 MG tablet Take 1 tablet (80 mg total) by mouth daily. 90 tablet 2   busPIRone (BUSPAR) 5 MG tablet Take 5 mg by mouth 3 (three) times daily.     cetirizine (ZYRTEC) 10 MG tablet Take 10 mg by mouth daily as needed for allergies.  fluticasone (FLONASE) 50 MCG/ACT nasal spray Place 1 spray into both nostrils daily as needed for allergies or rhinitis.     lisinopril  (ZESTRIL ) 5 MG tablet TAKE 1 TABLET(5 MG) BY MOUTH DAILY 90 tablet 0   nitroGLYCERIN  (NITROSTAT ) 0.4 MG SL tablet PLACE 1 TABLET UNDER THE TONGUE EVERY 5 MINUTES X 3 DOSES AS NEEDED FOR CHEST PAIN 25 tablet 9   No current facility-administered medications for this visit.   No results found.  Review of Systems:   A ROS was performed including pertinent positives and negatives as documented in the HPI.  Physical Exam :   Constitutional: NAD and appears stated age Neurological: Alert and oriented Psych: Appropriate affect and cooperative There were no vitals taken for this visit.    Comprehensive Musculoskeletal Exam:    Right knee exam demonstrates deformity over the superior lateral patella although no tenderness, swelling, or ecchymosis in this area.  No medial or lateral joint line tenderness is present.  There is tenderness with palpation over the tibial tuberosity.  Patient demonstrates active range of motion from 0 to 130 degrees with 5/5 strength in resisted knee flexion and extension.  Imaging:   Xray (right knee 4 views): Negative for acute bony abnormalities.  Bipartite patella.   I personally reviewed and interpreted the radiographs.      Assessment & Plan Acute right knee pain Patient presents with acute right knee pain which on exam is present directly over the tibial tuberosity.  This did begin suddenly a few days ago after feeling a popping sensation and he is having increased pain in deep knee flexion.  His physical exam is reassuring with intact range of motion and strength.  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  Discussed that there is some suspicion for patellar tendinopathy versus occult bony injury of this area given pinpoint tenderness.  Symptoms have improved slightly since yesterday therefore conservative management will was recommended.  Discussed activity modification as well as NSAIDs and ice.  Follow-up in 2 weeks with consideration of MRI if symptoms persist or worsen.  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       I personally saw and evaluated the patient, and participated in the management and treatment plan.  Leonce Reveal, PA-C Orthopedics

## 2023-12-05 ENCOUNTER — Other Ambulatory Visit: Payer: Self-pay | Admitting: Cardiology

## 2023-12-17 ENCOUNTER — Ambulatory Visit (HOSPITAL_BASED_OUTPATIENT_CLINIC_OR_DEPARTMENT_OTHER): Admitting: Student

## 2024-01-30 ENCOUNTER — Other Ambulatory Visit: Payer: Self-pay | Admitting: Cardiology

## 2024-01-30 NOTE — Telephone Encounter (Signed)
 In accordance with refill protocols, please review and address the following requirements before this medication refill can be authorized:  Labs

## 2024-01-31 NOTE — Telephone Encounter (Signed)
 Results Component Value Reference Range Notes  Lipid Panel Reviewed date:08/17/2023 06:09:22 AM Interpretation: Performing Lab: Notes/Report: Testing Performed at: [facility], [address], [place], [place] [address]  Cholesterol 133 <200 mg/dL    CHOL/HDL 2.2 7.9-5.9 Ratio    HDLD 59 30-70 mg/dL Values below 40 mg/dL indicate increased risk factor  Triglyceride 125 0-199 mg/dL    NHDL 74 9-870 mg/dL Range dependent upon risk factors.  LDL Chol Calc (NIH) 52 0-99 mg/dL    Comp Metabolic Panel Reviewed date:08/17/2023 06:09:21 AM Interpretation: Performing Lab: Notes/Report: Testing Performed at: [facility], [address], [address], [place], [place] [address]  Glucose 94 70-99 mg/dL    BUN 15 3-73 mg/dL    Creatinine 9.19 9.39-8.69 mg/dl    [id number] >39 calc Stage 1 > 90 ML/Min plus Albuminuria;Stage 2 60-89 ML/MIN;Stage 3 30-59 ML/MIN;Stage 4 15-29 ML/MIN;Stage 5 <15 ML/MIN  Sodium 139 136-145 mmol/L    Potassium 4.2 3.5-5.5 mmol/L      Lab completed as above - OK to refill
